# Patient Record
Sex: Female | Born: 1990
Health system: Southern US, Community
[De-identification: ages and names within clinical notes are randomized; demographics above are authoritative.]

## PROBLEM LIST (undated history)

## (undated) DIAGNOSIS — N83209 Unspecified ovarian cyst, unspecified side: Secondary | ICD-10-CM

## (undated) DIAGNOSIS — A6000 Herpesviral infection of urogenital system, unspecified: Secondary | ICD-10-CM

## (undated) DIAGNOSIS — J45909 Unspecified asthma, uncomplicated: Secondary | ICD-10-CM

## (undated) HISTORY — PX: EYE MUSCLE SURGERY: SHX370

## (undated) HISTORY — PX: OVARIAN CYST REMOVAL: SHX89

---

## 2013-06-05 ENCOUNTER — Emergency Department (HOSPITAL_COMMUNITY)
Admission: EM | Admit: 2013-06-05 | Discharge: 2013-06-05 | Disposition: A | Discharge status: Home / Self Care | Payer: 59 | Attending: Emergency Medicine | Admitting: Emergency Medicine

## 2013-06-05 ENCOUNTER — Emergency Department (HOSPITAL_COMMUNITY): Payer: 59

## 2013-06-05 ENCOUNTER — Encounter (HOSPITAL_COMMUNITY): Payer: Self-pay | Admitting: Emergency Medicine

## 2013-06-05 DIAGNOSIS — Z79899 Other long term (current) drug therapy: Secondary | ICD-10-CM | POA: Insufficient documentation

## 2013-06-05 DIAGNOSIS — S0990XA Unspecified injury of head, initial encounter: Secondary | ICD-10-CM | POA: Insufficient documentation

## 2013-06-05 DIAGNOSIS — S161XXA Strain of muscle, fascia and tendon at neck level, initial encounter: Secondary | ICD-10-CM

## 2013-06-05 DIAGNOSIS — Y939 Activity, unspecified: Secondary | ICD-10-CM | POA: Insufficient documentation

## 2013-06-05 DIAGNOSIS — Y92410 Unspecified street and highway as the place of occurrence of the external cause: Secondary | ICD-10-CM | POA: Insufficient documentation

## 2013-06-05 DIAGNOSIS — Z880 Allergy status to penicillin: Secondary | ICD-10-CM | POA: Insufficient documentation

## 2013-06-05 DIAGNOSIS — S139XXA Sprain of joints and ligaments of unspecified parts of neck, initial encounter: Secondary | ICD-10-CM | POA: Insufficient documentation

## 2013-06-05 HISTORY — DX: Herpesviral infection of urogenital system, unspecified: A60.00

## 2013-06-05 HISTORY — DX: Unspecified asthma, uncomplicated: J45.909

## 2013-06-05 MED ORDER — IBUPROFEN 400 MG PO TABS
800.0000 mg | ORAL_TABLET | Freq: Once | ORAL | Status: AC
  Administered 2013-06-05: 800 mg via ORAL
  Filled 2013-06-05: qty 2

## 2013-06-05 NOTE — ED Notes (Signed)
Received report from off going RN
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
## 2013-06-08 NOTE — ED Provider Notes (Signed)
Medical screening examination/treatment/procedure(s) were performed by non-physician practitioner and as supervising physician I was immediately available for consultation/collaboration.    Gilda Crease, MD 06/08/13 [phone]

## 2013-11-23 ENCOUNTER — Inpatient Hospital Stay (HOSPITAL_COMMUNITY): Payer: 59

## 2013-11-23 ENCOUNTER — Inpatient Hospital Stay (HOSPITAL_COMMUNITY)
Admission: AD | Admit: 2013-11-23 | Discharge: 2013-11-23 | Disposition: A | Discharge status: Home / Self Care | Payer: 59 | Source: Ambulatory Visit | Attending: Obstetrics and Gynecology | Admitting: Obstetrics and Gynecology

## 2013-11-23 ENCOUNTER — Encounter (HOSPITAL_COMMUNITY): Payer: Self-pay | Admitting: *Deleted

## 2013-11-23 DIAGNOSIS — D279 Benign neoplasm of unspecified ovary: Secondary | ICD-10-CM

## 2013-11-23 DIAGNOSIS — J45909 Unspecified asthma, uncomplicated: Secondary | ICD-10-CM | POA: Insufficient documentation

## 2013-11-23 DIAGNOSIS — R109 Unspecified abdominal pain: Secondary | ICD-10-CM | POA: Insufficient documentation

## 2013-11-23 DIAGNOSIS — D271 Benign neoplasm of left ovary: Secondary | ICD-10-CM | POA: Diagnosis present

## 2013-11-23 DIAGNOSIS — N83209 Unspecified ovarian cyst, unspecified side: Secondary | ICD-10-CM | POA: Insufficient documentation

## 2013-11-23 DIAGNOSIS — Z975 Presence of (intrauterine) contraceptive device: Secondary | ICD-10-CM

## 2013-11-23 LAB — URINALYSIS, ROUTINE W REFLEX MICROSCOPIC
Bilirubin Urine: NEGATIVE
Glucose, UA: NEGATIVE mg/dL
Ketones, ur: NEGATIVE mg/dL
Nitrite: NEGATIVE
PH: 7 (ref 5.0–8.0)
Protein, ur: NEGATIVE mg/dL
SPECIFIC GRAVITY, URINE: 1.015 (ref 1.005–1.030)
Urobilinogen, UA: 0.2 mg/dL (ref 0.0–1.0)

## 2013-11-23 LAB — URINE MICROSCOPIC-ADD ON

## 2013-11-23 LAB — POCT PREGNANCY, URINE: Preg Test, Ur: NEGATIVE

## 2013-11-23 MED ORDER — HYDROCODONE-ACETAMINOPHEN 5-500 MG PO TABS: 1.0000 | ORAL_TABLET | Freq: Four times a day (QID) | ORAL | Status: DC | PRN

## 2013-11-23 MED ORDER — IBUPROFEN 600 MG PO TABS: 600.0000 mg | ORAL_TABLET | Freq: Four times a day (QID) | ORAL | Status: DC | PRN

## 2013-11-23 MED ORDER — IBUPROFEN 800 MG PO TABS
800.0000 mg | ORAL_TABLET | Freq: Once | ORAL | Status: AC
  Administered 2013-11-23: 800 mg via ORAL
  Filled 2013-11-23: qty 1

## 2013-11-23 NOTE — MAU Note (Signed)
Has had IUD for a week -started having abdominal pain for past 4 days; c/o bumps on her pubic area that itch for past 2 days; felt strings; hx of HSV;

## 2013-11-23 NOTE — Discharge Instructions (Signed)
Ovarian Cyst °An ovarian cyst is a fluid-filled sac that forms on an ovary. The ovaries are small organs that produce eggs in women. Various types of cysts can form on the ovaries. Most are not cancerous. Many do not cause problems, and they often go away on their own. Some may cause symptoms and require treatment. Common types of ovarian cysts include: °· Functional cysts These cysts may occur every month during the menstrual cycle. This is normal. The cysts usually go away with the next menstrual cycle if the woman does not get pregnant. Usually, there are no symptoms with a functional cyst. °· Endometrioma cysts These cysts form from the tissue that lines the uterus. They are also called "chocolate cysts" because they become filled with blood that turns brown. This type of cyst can cause pain in the lower abdomen during intercourse and with your menstrual period. °· Cystadenoma cysts This type develops from the cells on the outside of the ovary. These cysts can get very big and cause lower abdomen pain and pain with intercourse. This type of cyst can twist on itself, cut off its blood supply, and cause severe pain. It can also easily rupture and cause a lot of pain. °· Dermoid cysts This type of cyst is sometimes found in both ovaries. These cysts may contain different kinds of body tissue, such as skin, teeth, hair, or cartilage. They usually do not cause symptoms unless they get very big. °· Theca lutein cysts These cysts occur when too much of a certain hormone (human chorionic gonadotropin) is produced and overstimulates the ovaries to produce an egg. This is most common after procedures used to assist with the conception of a baby (in vitro fertilization). °CAUSES  °· Fertility drugs can cause a condition in which multiple large cysts are formed on the ovaries. This is called ovarian hyperstimulation syndrome. °· A condition called polycystic ovary syndrome can cause hormonal imbalances that can lead to  nonfunctional ovarian cysts. °SIGNS AND SYMPTOMS  °Many ovarian cysts do not cause symptoms. If symptoms are present, they may include: °· Pelvic pain or pressure. °· Pain in the lower abdomen. °· Pain during sexual intercourse. °· Increasing girth (swelling) of the abdomen. °· Abnormal menstrual periods. °· Increasing pain with menstrual periods. °· Stopping having menstrual periods without being pregnant. °DIAGNOSIS  °These cysts are commonly found during a routine or annual pelvic exam. Tests may be ordered to find out more about the cyst. These tests may include: °· Ultrasound. °· X-ray of the pelvis. °· CT scan. °· MRI. °· Blood tests. °TREATMENT  °Many ovarian cysts go away on their own without treatment. Your health care provider may want to check your cyst regularly for 2 3 months to see if it changes. For women in menopause, it is particularly important to monitor a cyst closely because of the higher rate of ovarian cancer in menopausal women. When treatment is needed, it may include any of the following: °· A procedure to drain the cyst (aspiration). This may be done using a long needle and ultrasound. It can also be done through a laparoscopic procedure. This involves using a thin, lighted tube with a tiny camera on the end (laparoscope) inserted through a small incision. °· Surgery to remove the whole cyst. This may be done using laparoscopic surgery or an open surgery involving a larger incision in the lower abdomen. °· Hormone treatment or birth control pills. These methods are sometimes used to help dissolve a cyst. °HOME CARE   INSTRUCTIONS   Only take over-the-counter or prescription medicines as directed by your health care provider.  Follow up with your health care provider as directed.  Get regular pelvic exams and Pap tests. SEEK MEDICAL CARE IF:   Your periods are late, irregular, or painful, or they stop.  Your pelvic pain or abdominal pain does not go away.  Your abdomen becomes  larger or swollen.  You have pressure on your bladder or trouble emptying your bladder completely.  You have pain during sexual intercourse.  You have feelings of fullness, pressure, or discomfort in your stomach.  You lose weight for no apparent reason.  You feel generally ill.  You become constipated.  You lose your appetite.  You develop acne.  You have an increase in body and facial hair.  You are gaining weight, without changing your exercise and eating habits.  You think you are pregnant. SEEK IMMEDIATE MEDICAL CARE IF:   You have increasing abdominal pain.  You feel sick to your stomach (nauseous), and you throw up (vomit).  You develop a fever that comes on suddenly.  You have abdominal pain during a bowel movement.  Your menstrual periods become heavier than usual. Document Released: 09/01/2005 Document Revised: 06/22/2013 Document Reviewed: 05/09/2013 [facility] Patient Information 2014 [place].  Ovarian Cystectomy Ovarian cystectomy is surgery to remove a fluid-filled sac (cyst) on an ovary. The ovaries are small organs that produce eggs in women. Various types of cysts can form on the ovaries. Most are not cancerous. Surgery may be done if a cyst is large or is causing symptoms such as pain. It may also be done for a cyst that is or might be cancerous. This surgery can be done using a laparoscopic technique or an open abdominal technique. The laparoscopic technique involves smaller cuts (incisions) and a faster recovery time. The technique used will depend on your age, the type of cyst, and whether the cyst is cancerous. The laparoscopic technique is not used for a cancerous cyst. LET [facility] CARE PROVIDER KNOW ABOUT:   Any allergies you have.  All medicines you are taking, including vitamins, herbs, eye drops, creams, and over-the-counter medicines.  Previous problems you or members of your family have had with the use of anesthetics.  Any blood  disorders you have.  Previous surgeries you have had.  Medical conditions you have.  Any chance you might be pregnant. RISKS AND COMPLICATIONS Generally, this is a safe procedure. However, as with any procedure, complications can occur. Possible complications include:  Excessive bleeding.  Infection.  Injury to other organs.  Blood clots.  Becoming incapable of getting pregnant (infertile). BEFORE THE PROCEDURE  Ask your health care provider about changing or stopping any regular medicines. Avoid taking aspirin, ibuprofen, or blood thinners as directed by your health care provider.  Do not eat or drink anything after midnight the night before surgery.  If you smoke, do not smoke for at least 2 weeks before your surgery.  Do not drink alcohol the day before your surgery.  Let your health care provider know if you develop a cold or any infection before your surgery.  Arrange for someone to drive you home after the procedure or after your hospital stay. Also arrange for someone to help you with activities during recovery. PROCEDURE  Either a laparoscopic technique or an open abdominal technique may be used for this surgery.  Small monitors will be put on your body. They are used to check your heart, blood  pressure, and oxygen level.   An IV access tube will be put into one of your veins. Medicine will be able to flow directly into your body through this IV tube.   You might be given a medicine to help you relax (sedative).   You will be given a medicine to make you sleep (general anesthetic). A breathing tube may be placed into your lungs during the procedure. Laparoscopic Technique  Several small cuts (incisions) are made in your abdomen. These are typically about 1 to 2 cm long.   Your abdomen will be filled with carbon dioxide gas so that it expands. This gives the surgeon more room to operate and makes your organs easier to see.   A thin, lighted tube with a  tiny camera on the end (laparoscope) is put through one of the small incisions. The camera on the laparoscope sends a picture to a TV screen in the operating room. This gives the surgeon a good view inside your abdomen.   Hollow tubes are put through the other small incisions in your abdomen. The tools needed for the procedure are put through these tubes.  The ovary with the cyst is identified, and the cyst is removed. It is sent to the lab for testing. If it is cancer, both ovaries may need to be removed during a different surgery.  Tools are removed. The incisions are then closed with stitches or skin glue, and dressings may be applied. Open Abdominal Technique  A single large incision is made along your bikini line or in the middle of your lower abdomen.  The ovary with the cyst is identified, and the cyst is removed. It is sent to the lab for testing. If it is cancer, both ovaries may need to be removed during a different surgery.  The incision is then closed with stitches or staples. AFTER THE PROCEDURE   You will wake up from anesthesia and be taken to a recovery area.  If you had laparoscopic surgery, you may be able to go home the same day, or you may need to stay in the hospital overnight.  If you had open abdominal surgery, you will need to stay in the hospital for a few days.  Your IV access tube and catheter will be removed the first or second day, after you are able to eat and drink enough.  You may be given medicine to relieve pain or to help you sleep.  You may be given an antibiotic medicine if needed. Document Released: 06/29/2007 Document Revised: 06/22/2013 Document Reviewed: 04/13/2013 [facility] Patient Information 2014 [place].   Ovarian Torsion The ovaries are female reproductive organs that produce eggs. Ovarian torsion is when an ovary becomes twisted and cuts off its own blood supply. This can occur at any age. If an ovary is twisted, it cannot get  blood and the ovary swells. It is a painful medical emergency. It must be treated quickly. If too much time has past, blood flow to the ovary may not be restored and the ovary may have to be removed. CAUSES Torsion can happen in an ovary that is normal size. However, most of the time it occurs in an ovary that is enlarged. An ovary can become enlarged because of:  Harmless (benign) tumors on the ovaries.  Cancerous tumors.  Ovarian cysts, which are fluid-filled sacs.  Normal pregnancy.  A pregnancy that occurs outside the uterus (ectopic pregnancy). RISK FACTORS Risk factors are things that increase the likelihood of this  condition happening. The risk factors include:  Having fallopian tubes that are longer than normal.  Having ovaries that are larger than normal.  Taking fertility medicine to become pregnant.  Having had surgery in the pelvic area. SYMPTOMS  Sudden pain in the lower abdomen, usually on one side only.  Pelvic pain that starts after exercise.  Pelvic pain that gets worse over time.  Severe pelvic pain that comes and goes.  Pelvic pain that spreads into the lower back or thigh.  Nausea and vomiting along with pelvic pain. DIAGNOSIS Your caregiver will take a history and perform a physical exam. He or she may be able to feel an enlarged ovary. Your caregiver may order some further tests, which include:  A pregnancy test.  Imaging tests, such as pelvic Doppler ultrasonography, that measures blood flow, CT scan, or MRI. Your caregiver may also perform a diagnositic laparoscopic exam. A small surgical cut (incision) will be made in your abdomen. Then, a small lighted telescope is put through the opening. This allows your caregiver to clearly see your ovary and fallopian tube.  TREATMENT Surgery is needed when an ovary becomes twisted. It is best to do this 8 hours or less after the ovary becomes twisted. Laparoscopic ovarian torsion surgery is done to try to  untwist the ovary. Sometimes, a large incision has to be made in the abdomen (laparotomy) to relieve the ovary. If the ovary cannot be untwisted, the ovary will have to be surgically removed during a procedure called salpingo-oophorectomy. Document Released: 08/21/2011 Document Revised: 11/24/2011 Document Reviewed: 08/21/2011 [facility] Patient Information 2014 [place], [place].

## 2013-11-23 NOTE — MAU Note (Signed)
Patient states she had Mirena IUD placed by her MD in [place] on 3-2. Had a little for a few days after placement. Woke up 3-8 with sharp lower abdominal pain. States she has felt the string. State she had 3 bumps on the pubic area that are white. Has a vaginal discharge but has been there since placement of IUD.

## 2013-11-23 NOTE — MAU Provider Note (Signed)
CC: Abdominal Pain    First Provider Initiated Contact with Patient 11/23/13 1907      HPI Leah [name] is a 23 y.o. G0P0   who presents with onset 4 days ago of severe sharp pelvic and lower abdominal pain. Pain is continuous that waxes and wanes. The pain is more severe than she normally has with menses. LMP 11/03/2013. Ibuprofen 800 mg gives some relief temporarily.  She is a Ship broker at [employer] and saw her gynecologist in [place] on 11-2013 and had Mirena IUD inserted. She had mild cramping for a few days which resolved. Since insertion she has had spotting but no heavy bleeding. Just completed Flagyl for BV.    Past Medical History  Diagnosis Date  . Asthma   . Herpes genitalia     OB History  Gravida Para Term Preterm AB SAB TAB Ectopic Multiple Living  0                 Past Surgical History  Procedure Laterality Date  . Eye muscle surgery      History   Social History  . Marital Status: Single    Spouse Name: N/A    Number of Children: N/A  . Years of Education: N/A   Occupational History  . Not on file.   Social History Main Topics  . Smoking status: Never Smoker   . Smokeless tobacco: Not on file  . Alcohol Use: No  . Drug Use: No  . Sexual Activity: Not on file   Other Topics Concern  . Not on file   Social History Narrative  . No narrative on file    No current facility-administered medications on file prior to encounter.   Current Outpatient Prescriptions on File Prior to Encounter  Medication Sig Dispense Refill  . valACYclovir (VALTREX) 500 MG tablet Take 500 mg by mouth daily.        Allergies  Allergen Reactions  . Penicillins Other (See Comments)    Family allergy     ROS Pertinent items in HPI  PHYSICAL EXAM Filed Vitals:   11/23/13 1825  BP: 125/62  Pulse: 93  Temp: 99.1 F (37.3 C)  Resp: 16   General: Well nourished, well developed female in no acute distress Cardiovascular: Normal rate Respiratory: Normal effort Abdomen:  Soft, nontender Back: No CVAT Extremities: No edema Neurologic: Alert and oriented Speculum exam: Mons is shaved has .5cm indurated lesion c/w healing furuncle, no evidence of genital HSV;  vagina with physiologic discharge, no blood; cervix clean with IUD strings in place Bimanual exam: cervix closed, no CMT; uterus NSSP; no adnexal tenderness or masses   LAB RESULTS Results for orders placed during the hospital encounter of 11/23/13 (from the past 24 hour(s))  URINALYSIS, ROUTINE W REFLEX MICROSCOPIC     Status: Abnormal   Collection Time    11/23/13  6:30 PM      Result Value Ref Range   Color, Urine YELLOW  YELLOW   APPearance CLEAR  CLEAR   Specific Gravity, Urine 1.015  1.005 - 1.030   pH 7.0  5.0 - 8.0   Glucose, UA NEGATIVE  NEGATIVE mg/dL   Hgb urine dipstick MODERATE (*) NEGATIVE   Bilirubin Urine NEGATIVE  NEGATIVE   Ketones, ur NEGATIVE  NEGATIVE mg/dL   Protein, ur NEGATIVE  NEGATIVE mg/dL   Urobilinogen, UA 0.2  0.0 - 1.0 mg/dL   Nitrite NEGATIVE  NEGATIVE   Leukocytes, UA TRACE (*) NEGATIVE  URINE MICROSCOPIC-ADD ON  Status: Abnormal   Collection Time    11/23/13  6:30 PM      Result Value Ref Range   Squamous Epithelial / LPF FEW (*) RARE   WBC, UA 3-6  <3 WBC/hpf   RBC / HPF 3-6  <3 RBC/hpf  POCT PREGNANCY, URINE     Status: None   Collection Time    11/23/13  6:46 PM      Result Value Ref Range   Preg Test, Ur NEGATIVE  NEGATIVE    IMAGING No results found.  MAU COURSE Reviewed preliminary [place] report with Dr. Glo Herring: rt cmplx hemorrhagic cyst 3.6x3.8cm; left: complex 5.2x4.8 probable dermoid and 3 cm simple cyst Kline assumed care at 2120  ASSESSMENT  No diagnosis found.  PLAN Discharge home. See AVS for patient education.    Medication List    ASK your doctor about these medications       ibuprofen 200 MG tablet  Commonly known as:  ADVIL,MOTRIN  Take 800 mg by mouth every 6 (six) hours as needed.     VALTREX 500 MG  tablet  Generic drug:  valACYclovir  Take 500 mg by mouth daily.          Lorene Dy, CNM 11/23/2013 7:29 PM

## 2013-11-24 ENCOUNTER — Other Ambulatory Visit: Payer: Self-pay | Admitting: Advanced Practice Midwife

## 2013-11-24 MED ORDER — HYDROCODONE-ACETAMINOPHEN 5-325 MG PO TABS: 1.0000 | ORAL_TABLET | Freq: Four times a day (QID) | ORAL | Status: DC | PRN

## 2013-11-24 MED ORDER — HYDROCODONE-ACETAMINOPHEN 5-325 MG PO TABS
1.0000 | ORAL_TABLET | Freq: Four times a day (QID) | ORAL | Status: DC | PRN
Start: 2013-11-24 — End: 2013-11-24

## 2013-11-24 NOTE — Progress Notes (Unsigned)
Rx never would print (rx'd by myself and Delana Meyer) many times.  Finally given a written rx.  [name],Leah [name]

## 2013-11-24 NOTE — Progress Notes (Signed)
rx for vicodin 5/500 written yesterday; dosage is no longer being made.  New rx for 5/325 given to pt

## 2013-11-25 NOTE — MAU Provider Note (Signed)
Attestation of Attending Supervision of Advanced Practitioner: Evaluation and management procedures were performed by the PA/NP/CNM/OB Fellow under my supervision/collaboration. Chart reviewed and agree with management and plan.  Kamari Bilek V 11/25/2013 6:45 PM

## 2013-12-12 ENCOUNTER — Encounter: Payer: 59 | Admitting: Obstetrics & Gynecology

## 2014-01-19 ENCOUNTER — Emergency Department (HOSPITAL_COMMUNITY): Payer: 59

## 2014-01-19 ENCOUNTER — Encounter (HOSPITAL_COMMUNITY): Payer: Self-pay | Admitting: Emergency Medicine

## 2014-01-19 ENCOUNTER — Emergency Department (HOSPITAL_COMMUNITY)
Admission: EM | Admit: 2014-01-19 | Discharge: 2014-01-19 | Disposition: A | Discharge status: Home / Self Care | Payer: 59 | Attending: Emergency Medicine | Admitting: Emergency Medicine

## 2014-01-19 DIAGNOSIS — Z8619 Personal history of other infectious and parasitic diseases: Secondary | ICD-10-CM | POA: Insufficient documentation

## 2014-01-19 DIAGNOSIS — N390 Urinary tract infection, site not specified: Secondary | ICD-10-CM | POA: Insufficient documentation

## 2014-01-19 DIAGNOSIS — Z79899 Other long term (current) drug therapy: Secondary | ICD-10-CM | POA: Insufficient documentation

## 2014-01-19 DIAGNOSIS — R1031 Right lower quadrant pain: Secondary | ICD-10-CM | POA: Insufficient documentation

## 2014-01-19 DIAGNOSIS — Z880 Allergy status to penicillin: Secondary | ICD-10-CM | POA: Insufficient documentation

## 2014-01-19 DIAGNOSIS — R000 Tachycardia, unspecified: Secondary | ICD-10-CM | POA: Insufficient documentation

## 2014-01-19 DIAGNOSIS — Z8742 Personal history of other diseases of the female genital tract: Secondary | ICD-10-CM | POA: Insufficient documentation

## 2014-01-19 DIAGNOSIS — Z3202 Encounter for pregnancy test, result negative: Secondary | ICD-10-CM | POA: Insufficient documentation

## 2014-01-19 DIAGNOSIS — R42 Dizziness and giddiness: Secondary | ICD-10-CM | POA: Insufficient documentation

## 2014-01-19 DIAGNOSIS — R109 Unspecified abdominal pain: Secondary | ICD-10-CM

## 2014-01-19 DIAGNOSIS — R112 Nausea with vomiting, unspecified: Secondary | ICD-10-CM | POA: Insufficient documentation

## 2014-01-19 DIAGNOSIS — J45909 Unspecified asthma, uncomplicated: Secondary | ICD-10-CM | POA: Insufficient documentation

## 2014-01-19 HISTORY — DX: Unspecified ovarian cyst, unspecified side: N83.209

## 2014-01-19 LAB — URINALYSIS, ROUTINE W REFLEX MICROSCOPIC
Glucose, UA: NEGATIVE mg/dL
Ketones, ur: 40 mg/dL — AB
Nitrite: NEGATIVE
PH: 6 (ref 5.0–8.0)
Protein, ur: NEGATIVE mg/dL
SPECIFIC GRAVITY, URINE: 1.027 (ref 1.005–1.030)
UROBILINOGEN UA: 1 mg/dL (ref 0.0–1.0)

## 2014-01-19 LAB — BASIC METABOLIC PANEL
BUN: 9 mg/dL (ref 6–23)
CALCIUM: 8.4 mg/dL (ref 8.4–10.5)
CO2: 23 meq/L (ref 19–32)
CREATININE: 0.86 mg/dL (ref 0.50–1.10)
Chloride: 101 mEq/L (ref 96–112)
GFR calc Af Amer: >90 mL/min (ref >90)
GFR calc non Af Amer: >90 mL/min (ref >90)
Glucose, Bld: 104 mg/dL — ABNORMAL HIGH (ref 70–99)
Potassium: 3.3 mEq/L — ABNORMAL LOW (ref 3.7–5.3)
Sodium: 139 mEq/L (ref 137–147)

## 2014-01-19 LAB — CBC
HCT: 36.3 % (ref 36.0–46.0)
Hemoglobin: 11.9 g/dL — ABNORMAL LOW (ref 12.0–15.0)
MCH: 28.2 pg (ref 26.0–34.0)
MCHC: 32.8 g/dL (ref 30.0–36.0)
MCV: 86 fL (ref 78.0–100.0)
PLATELETS: 294 10*3/uL (ref 150–400)
RBC: 4.22 MIL/uL (ref 3.87–5.11)
RDW: 12.9 % (ref 11.5–15.5)
WBC: 14.7 10*3/uL — ABNORMAL HIGH (ref 4.0–10.5)

## 2014-01-19 LAB — PREGNANCY, URINE: Preg Test, Ur: NEGATIVE

## 2014-01-19 LAB — URINE MICROSCOPIC-ADD ON

## 2014-01-19 LAB — LIPASE, BLOOD: LIPASE: 14 U/L (ref 11–59)

## 2014-01-19 LAB — RAPID STREP SCREEN (MED CTR MEBANE ONLY): Streptococcus, Group A Screen (Direct): NEGATIVE

## 2014-01-19 MED ORDER — ONDANSETRON 4 MG PO TBDP: 4.0000 mg | ORAL_TABLET | Freq: Three times a day (TID) | ORAL | Status: DC | PRN

## 2014-01-19 MED ORDER — IOHEXOL 300 MG/ML  SOLN
100.0000 mL | Freq: Once | INTRAMUSCULAR | Status: AC | PRN
  Administered 2014-01-19: 100 mL via INTRAVENOUS

## 2014-01-19 MED ORDER — ONDANSETRON HCL 4 MG/2ML IJ SOLN
4.0000 mg | Freq: Once | INTRAMUSCULAR | Status: AC
  Administered 2014-01-19: 4 mg via INTRAVENOUS
  Filled 2014-01-19: qty 2

## 2014-01-19 MED ORDER — ACETAMINOPHEN 325 MG PO TABS
325.0000 mg | ORAL_TABLET | Freq: Once | ORAL | Status: AC
  Administered 2014-01-19: 325 mg via ORAL
  Filled 2014-01-19: qty 1

## 2014-01-19 MED ORDER — SODIUM CHLORIDE 0.9 % IV BOLUS (SEPSIS)
1000.0000 mL | Freq: Once | INTRAVENOUS | Status: AC
  Administered 2014-01-19: 1000 mL via INTRAVENOUS

## 2014-01-19 MED ORDER — SULFAMETHOXAZOLE-TRIMETHOPRIM 800-160 MG PO TABS: 1.0000 | ORAL_TABLET | Freq: Two times a day (BID) | ORAL | Status: DC

## 2014-01-19 MED ORDER — HYDROCODONE-ACETAMINOPHEN 5-325 MG PO TABS: 2.0000 | ORAL_TABLET | ORAL | Status: DC | PRN

## 2014-01-19 MED ORDER — SODIUM CHLORIDE 0.9 % IV BOLUS (SEPSIS)
1000.0000 mL | Freq: Once | INTRAVENOUS | Status: AC
Start: 2014-01-19 — End: 2014-01-19
  Administered 2014-01-19: 1000 mL via INTRAVENOUS

## 2014-01-19 MED ORDER — IOHEXOL 300 MG/ML  SOLN
25.0000 mL | Freq: Once | INTRAMUSCULAR | Status: AC | PRN
  Administered 2014-01-19: 25 mL via ORAL

## 2014-01-19 NOTE — ED Notes (Signed)
Pt reports nausea and loss of appetite over the past few days; tachy with low grade fever. A&Ox3; no signs of distress. Vomited last night x 2.

## 2014-01-19 NOTE — ED Provider Notes (Signed)
Results for orders placed during the hospital encounter of 01/19/14  RAPID STREP SCREEN      Result Value Ref Range   Streptococcus, Group A Screen (Direct) NEGATIVE  NEGATIVE  CBC      Result Value Ref Range   WBC 14.7 (*) 4.0 - 10.5 K/uL   RBC 4.22  3.87 - 5.11 MIL/uL   Hemoglobin 11.9 (*) 12.0 - 15.0 g/dL   HCT 36.3  36.0 - 46.0 %   MCV 86.0  78.0 - 100.0 fL   MCH 28.2  26.0 - 34.0 pg   MCHC 32.8  30.0 - 36.0 g/dL   RDW 12.9  11.5 - 15.5 %   Platelets 294  150 - 400 K/uL  BASIC METABOLIC PANEL      Result Value Ref Range   Sodium 139  137 - 147 mEq/L   Potassium 3.3 (*) 3.7 - 5.3 mEq/L   Chloride 101  96 - 112 mEq/L   CO2 23  19 - 32 mEq/L   Glucose, Bld 104 (*) 70 - 99 mg/dL   BUN 9  6 - 23 mg/dL   Creatinine, Ser 0.86  0.50 - 1.10 mg/dL   Calcium 8.4  8.4 - 10.5 mg/dL   GFR calc non Af Amer >90  >90 mL/min   GFR calc Af Amer >90  >90 mL/min  LIPASE, BLOOD      Result Value Ref Range   Lipase 14  11 - 59 U/L  PREGNANCY, URINE      Result Value Ref Range   Preg Test, Ur NEGATIVE  NEGATIVE  URINALYSIS, ROUTINE W REFLEX MICROSCOPIC      Result Value Ref Range   Color, Urine YELLOW  YELLOW   APPearance HAZY (*) CLEAR   Specific Gravity, Urine 1.027  1.005 - 1.030   pH 6.0  5.0 - 8.0   Glucose, UA NEGATIVE  NEGATIVE mg/dL   Hgb urine dipstick LARGE (*) NEGATIVE   Bilirubin Urine SMALL (*) NEGATIVE   Ketones, ur 40 (*) NEGATIVE mg/dL   Protein, ur NEGATIVE  NEGATIVE mg/dL   Urobilinogen, UA 1.0  0.0 - 1.0 mg/dL   Nitrite NEGATIVE  NEGATIVE   Leukocytes, UA SMALL (*) NEGATIVE  URINE MICROSCOPIC-ADD ON      Result Value Ref Range   Squamous Epithelial / LPF MANY (*) RARE   WBC, UA 11-20  <3 WBC/hpf   RBC / HPF 11-20  <3 RBC/hpf   Bacteria, UA MANY (*) RARE   Urine-Other RARE YEAST     Dg Chest 2 View  01/19/2014   CLINICAL DATA:  Tachycardia.  EXAM: CHEST  2 VIEW  COMPARISON:  None.  FINDINGS: Mediastinum and hilar structures normal. Lungs are clear. Heart size  normal.  IMPRESSION: No pleural effusion or pneumothorax.   Electronically Signed   By: Marcello Moores  Register   On: 01/19/2014 12:27   US Transvaginal Non-ob  01/19/2014   CLINICAL DATA:  Pelvic pain with nausea and vomiting and off and on chills. LMP 01/17/2014.  EXAM: TRANSABDOMINAL AND TRANSVAGINAL ULTRASOUND OF PELVIS  DOPPLER ULTRASOUND OF OVARIES  TECHNIQUE: Both transabdominal and transvaginal ultrasound examinations of the pelvis were performed. Transabdominal technique was performed for global imaging of the pelvis including uterus, ovaries, adnexal regions, and pelvic cul-de-sac.  It was necessary to proceed with endovaginal exam following the transabdominal exam to visualize the ovaries and endometrium. Color and duplex Doppler ultrasound was utilized to evaluate blood flow to the ovaries.  COMPARISON:  CT abdomen pelvis 01/19/2014 and pelvic ultrasound 11/24/1998.  FINDINGS: Uterus  Measurements: 5.9 x 3.0 x 4.5 cm and anteverted. No fibroids or other mass visualized.  Endometrium  Thickness: 6 mm. Intrauterine device is satisfactorily positioned within the endometrial canal.  Right ovary  Measurements: 4.4 x 2.4 x 3.3 cm. Normal appearance/no adnexal mass. Pulsed Doppler evaluation of the right ovary demonstrates normal low resistance arterial and venous waveforms.  Left ovary  Measurements: Measures approximately 7.9 x 4.5 x 5.3 cm and contains an approximately 5.3 x 3.9 x 4.6 cm mass with multiple low level internal echoes and scattered areas of hyperechogenicity within it with associated shadowing. This has sonographic appearance s compatible with an ovarian dermoid, and definite fat within this mass was confirmed on CT abdomen pelvis performed today, confirming a dermoid. There is also a simple cyst exophytic from or immediately adjacent to the left ovary that measures 3.5 x 2.7 x 3.7 cm. No discrete normal ovarian parenchyma is seen on the left. On color Doppler imaging, there is color Doppler flow  along the periphery of the dermoid and left adnexal simple cyst. No flow seen within the dermoid or simple cyst. Arterial and venous waveforms can be seen within this rind of tissue surrounding the complex mass and cyst.  Other findings  Small amount of free fluid in the pelvis.  IMPRESSION: 1. Left ovarian dermoid measures approximately 5.3 x 3.9 x 4.6 cm. There is also a left ovarian versus adnexal simple cyst that measures 3.5 x 2.7 x 3.7 cm. Please note that the presence of the dermoid does place the patient at increased risk for ovarian torsion. Arterial and venous waveforms are seen around the periphery of the large dermoid on today's ultrasound, with no definite sonographic evidence of torsion. Consider gynecology consultation or follow-up. 2. Normal right ovary. 3. Satisfactory position of intrauterine device.   Electronically Signed   By: Curlene Dolphin M.D.   On: 01/19/2014 16:26   US Pelvis Complete  01/19/2014   CLINICAL DATA:  Pelvic pain with nausea and vomiting and off and on chills. LMP 01/17/2014.  EXAM: TRANSABDOMINAL AND TRANSVAGINAL ULTRASOUND OF PELVIS  DOPPLER ULTRASOUND OF OVARIES  TECHNIQUE: Both transabdominal and transvaginal ultrasound examinations of the pelvis were performed. Transabdominal technique was performed for global imaging of the pelvis including uterus, ovaries, adnexal regions, and pelvic cul-de-sac.  It was necessary to proceed with endovaginal exam following the transabdominal exam to visualize the ovaries and endometrium. Color and duplex Doppler ultrasound was utilized to evaluate blood flow to the ovaries.  COMPARISON:  CT abdomen pelvis 01/19/2014 and pelvic ultrasound 11/24/1998.  FINDINGS: Uterus  Measurements: 5.9 x 3.0 x 4.5 cm and anteverted. No fibroids or other mass visualized.  Endometrium  Thickness: 6 mm. Intrauterine device is satisfactorily positioned within the endometrial canal.  Right ovary  Measurements: 4.4 x 2.4 x 3.3 cm. Normal appearance/no adnexal  mass. Pulsed Doppler evaluation of the right ovary demonstrates normal low resistance arterial and venous waveforms.  Left ovary  Measurements: Measures approximately 7.9 x 4.5 x 5.3 cm and contains an approximately 5.3 x 3.9 x 4.6 cm mass with multiple low level internal echoes and scattered areas of hyperechogenicity within it with associated shadowing. This has sonographic appearance s compatible with an ovarian dermoid, and definite fat within this mass was confirmed on CT abdomen pelvis performed today, confirming a dermoid. There is also a simple cyst exophytic from or immediately adjacent to the left ovary that measures 3.5 x  2.7 x 3.7 cm. No discrete normal ovarian parenchyma is seen on the left. On color Doppler imaging, there is color Doppler flow along the periphery of the dermoid and left adnexal simple cyst. No flow seen within the dermoid or simple cyst. Arterial and venous waveforms can be seen within this rind of tissue surrounding the complex mass and cyst.  Other findings  Small amount of free fluid in the pelvis.  IMPRESSION: 1. Left ovarian dermoid measures approximately 5.3 x 3.9 x 4.6 cm. There is also a left ovarian versus adnexal simple cyst that measures 3.5 x 2.7 x 3.7 cm. Please note that the presence of the dermoid does place the patient at increased risk for ovarian torsion. Arterial and venous waveforms are seen around the periphery of the large dermoid on today's ultrasound, with no definite sonographic evidence of torsion. Consider gynecology consultation or follow-up. 2. Normal right ovary. 3. Satisfactory position of intrauterine device.   Electronically Signed   By: Curlene Dolphin M.D.   On: 01/19/2014 16:26   Ct Abdomen Pelvis W Contrast  01/19/2014   CLINICAL DATA:  Right lower quadrant abdominal pain.  EXAM: CT ABDOMEN AND PELVIS WITH CONTRAST  TECHNIQUE: Multidetector CT imaging of the abdomen and pelvis was performed using the standard protocol following bolus administration  of intravenous contrast.  CONTRAST:  100 mL OMNIPAQUE IOHEXOL 300 MG/ML  SOLN  COMPARISON:  None.  FINDINGS: The lung bases are clear. There is no pleural or pericardial effusion.  The appendix is well seen posterior to the cecum and is normal in appearance. The stomach and small and large bowel appear normal. A fluid, fat and calcium containing lesion in the left ovary is consistent with a dermoid cyst measuring 5.3 cm AP by 4.6 cm transverse by 5.8 cm craniocaudal. The right ovary is unremarkable. IUD is in place in the uterus. The patient has a tampon in place. Urinary bladder is unremarkable.  The gallbladder, liver, spleen, adrenal glands, kidneys, biliary tree and pancreas all appear normal. No lymphadenopathy is identified. Tiny amount of free pelvic fluid is consistent with physiologic change. No bony abnormality is identified.  IMPRESSION: Negative for appendicitis or other acute abnormality.  Left ovarian dermoid measuring a maximum of 5.8 cm in diameter.   Electronically Signed   By: Inge Rise M.D.   On: 01/19/2014 15:30   [place] Art/ven Flow Abd Pelv Doppler  01/19/2014   CLINICAL DATA:  Pelvic pain with nausea and vomiting and off and on chills. LMP 01/17/2014.  EXAM: TRANSABDOMINAL AND TRANSVAGINAL ULTRASOUND OF PELVIS  DOPPLER ULTRASOUND OF OVARIES  TECHNIQUE: Both transabdominal and transvaginal ultrasound examinations of the pelvis were performed. Transabdominal technique was performed for global imaging of the pelvis including uterus, ovaries, adnexal regions, and pelvic cul-de-sac.  It was necessary to proceed with endovaginal exam following the transabdominal exam to visualize the ovaries and endometrium. Color and duplex Doppler ultrasound was utilized to evaluate blood flow to the ovaries.  COMPARISON:  CT abdomen pelvis 01/19/2014 and pelvic ultrasound 11/24/1998.  FINDINGS: Uterus  Measurements: 5.9 x 3.0 x 4.5 cm and anteverted. No fibroids or other mass visualized.  Endometrium   Thickness: 6 mm. Intrauterine device is satisfactorily positioned within the endometrial canal.  Right ovary  Measurements: 4.4 x 2.4 x 3.3 cm. Normal appearance/no adnexal mass. Pulsed Doppler evaluation of the right ovary demonstrates normal low resistance arterial and venous waveforms.  Left ovary  Measurements: Measures approximately 7.9 x 4.5 x 5.3 cm and contains  an approximately 5.3 x 3.9 x 4.6 cm mass with multiple low level internal echoes and scattered areas of hyperechogenicity within it with associated shadowing. This has sonographic appearance s compatible with an ovarian dermoid, and definite fat within this mass was confirmed on CT abdomen pelvis performed today, confirming a dermoid. There is also a simple cyst exophytic from or immediately adjacent to the left ovary that measures 3.5 x 2.7 x 3.7 cm. No discrete normal ovarian parenchyma is seen on the left. On color Doppler imaging, there is color Doppler flow along the periphery of the dermoid and left adnexal simple cyst. No flow seen within the dermoid or simple cyst. Arterial and venous waveforms can be seen within this rind of tissue surrounding the complex mass and cyst.  Other findings  Small amount of free fluid in the pelvis.  IMPRESSION: 1. Left ovarian dermoid measures approximately 5.3 x 3.9 x 4.6 cm. There is also a left ovarian versus adnexal simple cyst that measures 3.5 x 2.7 x 3.7 cm. Please note that the presence of the dermoid does place the patient at increased risk for ovarian torsion. Arterial and venous waveforms are seen around the periphery of the large dermoid on today's ultrasound, with no definite sonographic evidence of torsion. Consider gynecology consultation or follow-up. 2. Normal right ovary. 3. Satisfactory position of intrauterine device.   Electronically Signed   By: Curlene Dolphin M.D.   On: 01/19/2014 16:26    5:07 PM Patient here with nausea, loss of appetite and low grade fever with vomiting x 2 - no back  pain, but RLQ abdominal pain - CT does not show appendicitis, [place] without acute findings.  Believe the leukocytosis to be more related to possible UTI, which I will treat and send for culture.  Patient is able to tolerate po fluids, heart rate is 108.  Patient is stable to return home and low grade fever of 99.4.   Idalia Needle Joelyn Oms, PA-C 01/19/14 1711

## 2014-01-19 NOTE — ED Provider Notes (Signed)
CSN: [id number]     Arrival date & time 01/19/14  0940 History   First MD Initiated Contact with Patient 01/19/14 1000     Chief Complaint  Patient presents with  . Nausea  . Sore Throat     (Consider location/radiation/quality/duration/timing/severity/associated sxs/prior Treatment) The history is provided by the patient. No language interpreter was used.  Leah [name] is a 23 y/o F with PMHx of asthma, genital herpes and ovarian cysts (getting surgery performed on her left ovarian cyst on Friday 01/27/2014) presenting to the ED with sore throat, generalized bodyaches, nausea, vomiting. Patient reported that the sore throat started yesterady - stated a sharp pain with swallowing. Reported that she has been having generalized bodyaches that have gotten worse. Reported that yesterday she felt nauseous with at least 2 episodes of vomiting. Reported that she has had a low grade fever, today it is 100.93F. Reported that she has been unable to keep any foods or fluids down. Reported that she goes to school and [place] campus. Denied abdominal pain, urinary issues, melena, Medicaid yet, chest pain, shortness of breath, difficulty breathing, cough, nasal congestion, neck pain, neck stiffness, blurred vision, sudden loss of vision. PCP none  Past Medical History  Diagnosis Date  . Asthma   . Herpes genitalia   . Ovarian cyst    Past Surgical History  Procedure Laterality Date  . Eye muscle surgery     Family History  Problem Relation Age of Onset  . Asthma Father   . Asthma Sister   . Asthma Brother   . Asthma Paternal Aunt   . Asthma Paternal Grandmother    History  Substance Use Topics  . Smoking status: Never Smoker   . Smokeless tobacco: Not on file  . Alcohol Use: No   OB History   Grav Para Term Preterm Abortions TAB SAB Ect Mult Living   0              Review of Systems  Constitutional: Positive for fever and chills.  HENT: Positive for sore throat. Negative for  congestion.   Respiratory: Negative for cough, chest tightness and shortness of breath.   Cardiovascular: Negative for chest pain.  Gastrointestinal: Positive for nausea, vomiting and abdominal pain. Negative for diarrhea, constipation, blood in stool and anal bleeding.  Genitourinary: Negative for dysuria and decreased urine volume.  Musculoskeletal: Positive for myalgias (generalized).  Neurological: Positive for light-headedness. Negative for dizziness and weakness.  All other systems reviewed and are negative.     Allergies  Penicillins  Home Medications   Prior to Admission medications   Medication Sig Start Date End Date Taking? Authorizing Provider  albuterol (PROVENTIL HFA;VENTOLIN HFA) 108 (90 BASE) MCG/ACT inhaler Inhale 2 puffs into the lungs every 6 (six) hours as needed for wheezing or shortness of breath.   Yes Historical Provider, MD  HYDROcodone-acetaminophen (NORCO) 5-325 MG per tablet Take 1 tablet by mouth every 6 (six) hours as needed for moderate pain. 11/24/13  Yes Christin Fudge, CNM  ibuprofen (ADVIL,MOTRIN) 600 MG tablet Take 600 mg by mouth every 6 (six) hours as needed for mild pain. 11/23/13  Yes Deirdre Freida Busman, CNM  levonorgestrel (MIRENA) 20 MCG/24HR IUD 1 each by Intrauterine route once.   Yes Historical Provider, MD  valACYclovir (VALTREX) 500 MG tablet Take 500 mg by mouth daily.   Yes Historical Provider, MD   BP 107/62  Pulse 105  Temp(Src) 99.8 F (37.7 C) (Oral)  Resp 18  SpO2 100%  LMP 01/17/2014 Physical Exam  Nursing note and vitals reviewed. Constitutional: She is oriented to person, place, and time. She appears well-developed and well-nourished. No distress.  HENT:  Head: Normocephalic and atraumatic.  Mouth/Throat: Oropharyngeal exudate present.  Eyes: Conjunctivae and EOM are normal. Pupils are equal, round, and reactive to light. Right eye exhibits no discharge. Left eye exhibits no discharge.  Neck: Normal range of motion.  Neck supple. No tracheal deviation present.  Negative neck stiffness Negative nuchal rigidity Negative cervical lymphadenopathy Negative meningeal signs  Cardiovascular: Regular rhythm and normal heart sounds.  Tachycardia present.   Pulmonary/Chest: Effort normal and breath sounds normal. No respiratory distress. She has no wheezes. She has no rales.  Abdominal: Soft. Bowel sounds are normal. She exhibits no distension. There is tenderness in the right lower quadrant. There is guarding. There is no rebound.    Discomfort upon palpation to the right lower quadrant-guarding with palpation  Musculoskeletal: Normal range of motion.  Full ROM to upper and lower extremities without difficulty noted, negative ataxia noted.  Lymphadenopathy:    She has no cervical adenopathy.  Neurological: She is alert and oriented to person, place, and time. No cranial nerve deficit. She exhibits normal muscle tone. Coordination normal.  Skin: Skin is warm and dry. No rash noted. She is not diaphoretic. No erythema.  Psychiatric: She has a normal mood and affect. Her behavior is normal. Thought content normal.    ED Course  Procedures (including critical care time) Labs Review Labs Reviewed  CBC - Abnormal; Notable for the following:    WBC 14.7 (*)    Hemoglobin 11.9 (*)    All other components within normal limits  BASIC METABOLIC PANEL - Abnormal; Notable for the following:    Potassium 3.3 (*)    Glucose, Bld 104 (*)    All other components within normal limits  URINALYSIS, ROUTINE W REFLEX MICROSCOPIC - Abnormal; Notable for the following:    APPearance HAZY (*)    Hgb urine dipstick LARGE (*)    Bilirubin Urine SMALL (*)    Ketones, ur 40 (*)    Leukocytes, UA SMALL (*)    All other components within normal limits  URINE MICROSCOPIC-ADD ON - Abnormal; Notable for the following:    Squamous Epithelial / LPF MANY (*)    Bacteria, UA MANY (*)    All other components within normal limits    RAPID STREP SCREEN  CULTURE, GROUP A STREP  LIPASE, BLOOD  PREGNANCY, URINE    Imaging Review Dg Chest 2 View  01/19/2014   CLINICAL DATA:  Tachycardia.  EXAM: CHEST  2 VIEW  COMPARISON:  None.  FINDINGS: Mediastinum and hilar structures normal. Lungs are clear. Heart size normal.  IMPRESSION: No pleural effusion or pneumothorax.   Electronically Signed   By: Marcello Moores  Register   On: 01/19/2014 12:27   Ct Abdomen Pelvis W Contrast  01/19/2014   CLINICAL DATA:  Right lower quadrant abdominal pain.  EXAM: CT ABDOMEN AND PELVIS WITH CONTRAST  TECHNIQUE: Multidetector CT imaging of the abdomen and pelvis was performed using the standard protocol following bolus administration of intravenous contrast.  CONTRAST:  100 mL OMNIPAQUE IOHEXOL 300 MG/ML  SOLN  COMPARISON:  None.  FINDINGS: The lung bases are clear. There is no pleural or pericardial effusion.  The appendix is well seen posterior to the cecum and is normal in appearance. The stomach and small and large bowel appear normal. A fluid, fat and calcium containing lesion in  the left ovary is consistent with a dermoid cyst measuring 5.3 cm AP by 4.6 cm transverse by 5.8 cm craniocaudal. The right ovary is unremarkable. IUD is in place in the uterus. The patient has a tampon in place. Urinary bladder is unremarkable.  The gallbladder, liver, spleen, adrenal glands, kidneys, biliary tree and pancreas all appear normal. No lymphadenopathy is identified. Tiny amount of free pelvic fluid is consistent with physiologic change. No bony abnormality is identified.  IMPRESSION: Negative for appendicitis or other acute abnormality.  Left ovarian dermoid measuring a maximum of 5.8 cm in diameter.   Electronically Signed   By: Inge Rise M.D.   On: 01/19/2014 15:30     EKG Interpretation None      MDM   Final diagnoses:  None   Medications  sodium chloride 0.9 % bolus 1,000 mL (0 mLs Intravenous Stopped 01/19/14 1242)  acetaminophen (TYLENOL) tablet 325  mg (325 mg Oral Given 01/19/14 1035)  iohexol (OMNIPAQUE) 300 MG/ML solution 25 mL (25 mLs Oral Contrast Given 01/19/14 1132)  sodium chloride 0.9 % bolus 1,000 mL (1,000 mLs Intravenous New Bag/Given 01/19/14 1330)  ondansetron (ZOFRAN) injection 4 mg (4 mg Intravenous Given 01/19/14 1329)  iohexol (OMNIPAQUE) 300 MG/ML solution 100 mL (100 mLs Intravenous Contrast Given 01/19/14 1506)   Filed Vitals:   01/19/14 1120 01/19/14 1130 01/19/14 1330 01/19/14 1430  BP: 102/81 110/63 109/68 107/62  Pulse: 104 112 106 105  Temp: 99.8 F (37.7 C)     TempSrc: Oral     Resp: 18     SpO2: 100% 100% 100% 100%    CBC elevated white blood cell count of 14.7. BMP noted mildly low potassium of 3.3-hypokalemia noted. Kidneys functioning well. Lipase negative elevation. Rapid strep negative. Urinalysis noted large hemoglobin with leukocytes with white blood cells 11-20 many bacteria noted many squamous cells-not a clean catch. Urine pregnancy negative. Chest x-ray negative for acute cardiac pulmonary disease-negative pleural effusion, pneumonia, pneumothorax noted. CT abdomen and pelvis with contrast negative for appendicitis or other acute abnormalities - left ovarian dermoid measuring a maximum of 5.8 cm in diameter - patient is due to get surgery on this Friday, 01/27/2014.  US pelvis pending.  Discussed case with Wille Glaser, PA-C. Transfer of care to [facility], PA-C at change in shift.    Jamse Mead, PA-C 01/19/14 1817

## 2014-01-19 NOTE — ED Notes (Signed)
Pt unable to void at this time. 

## 2014-01-19 NOTE — Discharge Instructions (Signed)
Abdominal Pain, Women °Abdominal (stomach, pelvic, or belly) pain can be caused by many things. It is important to tell your doctor: °· The location of the pain. °· Does it come and go or is it present all the time? °· Are there things that start the pain (eating certain foods, exercise)? °· Are there other symptoms associated with the pain (fever, nausea, vomiting, diarrhea)? °All of this is helpful to know when trying to find the cause of the pain. °CAUSES  °· Stomach: virus or bacteria infection, or ulcer. °· Intestine: appendicitis (inflamed appendix), regional ileitis (Crohn's disease), ulcerative colitis (inflamed colon), irritable bowel syndrome, diverticulitis (inflamed diverticulum of the colon), or cancer of the stomach or intestine. °· Gallbladder disease or stones in the gallbladder. °· Kidney disease, kidney stones, or infection. °· Pancreas infection or cancer. °· Fibromyalgia (pain disorder). °· Diseases of the female organs: °· Uterus: fibroid (non-cancerous) tumors or infection. °· Fallopian tubes: infection or tubal pregnancy. °· Ovary: cysts or tumors. °· Pelvic adhesions (scar tissue). °· Endometriosis (uterus lining tissue growing in the pelvis and on the pelvic organs). °· Pelvic congestion syndrome (female organs filling up with blood just before the menstrual period). °· Pain with the menstrual period. °· Pain with ovulation (producing an egg). °· Pain with an IUD (intrauterine device, birth control) in the uterus. °· Cancer of the female organs. °· Functional pain (pain not caused by a disease, may improve without treatment). °· Psychological pain. °· Depression. °DIAGNOSIS  °Your doctor will decide the seriousness of your pain by doing an examination. °· Blood tests. °· X-rays. °· Ultrasound. °· CT scan (computed tomography, special type of X-ray). °· MRI (magnetic resonance imaging). °· Cultures, for infection. °· Barium enema (dye inserted in the large intestine, to better view it with  X-rays). °· Colonoscopy (looking in intestine with a lighted tube). °· Laparoscopy (minor surgery, looking in abdomen with a lighted tube). °· Major abdominal exploratory surgery (looking in abdomen with a large incision). °TREATMENT  °The treatment will depend on the cause of the pain.  °· Many cases can be observed and treated at home. °· Over-the-counter medicines recommended by your caregiver. °· Prescription medicine. °· Antibiotics, for infection. °· Birth control pills, for painful periods or for ovulation pain. °· Hormone treatment, for endometriosis. °· Nerve blocking injections. °· Physical therapy. °· Antidepressants. °· Counseling with a psychologist or psychiatrist. °· Minor or major surgery. °HOME CARE INSTRUCTIONS  °· Do not take laxatives, unless directed by your caregiver. °· Take over-the-counter pain medicine only if ordered by your caregiver. Do not take aspirin because it can cause an upset stomach or bleeding. °· Try a clear liquid diet (broth or water) as ordered by your caregiver. Slowly move to a bland diet, as tolerated, if the pain is related to the stomach or intestine. °· Have a thermometer and take your temperature several times a day, and record it. °· Bed rest and sleep, if it helps the pain. °· Avoid sexual intercourse, if it causes pain. °· Avoid stressful situations. °· Keep your follow-up appointments and tests, as your caregiver orders. °· If the pain does not go away with medicine or surgery, you may try: °· Acupuncture. °· Relaxation exercises (yoga, meditation). °· Group therapy. °· Counseling. °SEEK MEDICAL CARE IF:  °· You notice certain foods cause stomach pain. °· Your home care treatment is not helping your pain. °· You need stronger pain medicine. °· You want your IUD removed. °· You feel faint or   lightheaded.  You develop nausea and vomiting.  You develop a rash.  You are having side effects or an allergy to your medicine. SEEK IMMEDIATE MEDICAL CARE IF:   Your  pain does not go away or gets worse.  You have a fever.  Your pain is felt only in portions of the abdomen. The right side could possibly be appendicitis. The left lower portion of the abdomen could be colitis or diverticulitis.  You are passing blood in your stools (bright red or black tarry stools, with or without vomiting).  You have blood in your urine.  You develop chills, with or without a fever.  You pass out. MAKE SURE YOU:   Understand these instructions.  Will watch your condition.  Will get help right away if you are not doing well or get worse. Document Released: 06/29/2007 Document Revised: 11/24/2011 Document Reviewed: 07/19/2009 [facility] Patient Information 2014 [place], [place].  Urinary Tract Infection Urinary tract infections (UTIs) can develop anywhere along your urinary tract. Your urinary tract is your body's drainage system for removing wastes and extra water. Your urinary tract includes two kidneys, two ureters, a bladder, and a urethra. Your kidneys are a pair of bean-shaped organs. Each kidney is about the size of your fist. They are located below your ribs, one on each side of your spine. CAUSES Infections are caused by microbes, which are microscopic organisms, including fungi, viruses, and bacteria. These organisms are so small that they can only be seen through a microscope. Bacteria are the microbes that most commonly cause UTIs. SYMPTOMS  Symptoms of UTIs may vary by age and gender of the patient and by the location of the infection. Symptoms in young women typically include a frequent and intense urge to urinate and a painful, burning feeling in the bladder or urethra during urination. Older women and men are more likely to be tired, shaky, and weak and have muscle aches and abdominal pain. A fever may mean the infection is in your kidneys. Other symptoms of a kidney infection include pain in your back or sides below the ribs, nausea, and  vomiting. DIAGNOSIS To diagnose a UTI, your caregiver will ask you about your symptoms. Your caregiver also will ask to provide a urine sample. The urine sample will be tested for bacteria and white blood cells. White blood cells are made by your body to help fight infection. TREATMENT  Typically, UTIs can be treated with medication. Because most UTIs are caused by a bacterial infection, they usually can be treated with the use of antibiotics. The choice of antibiotic and length of treatment depend on your symptoms and the type of bacteria causing your infection. HOME CARE INSTRUCTIONS  If you were prescribed antibiotics, take them exactly as your caregiver instructs you. Finish the medication even if you feel better after you have only taken some of the medication.  Drink enough water and fluids to keep your urine clear or pale yellow.  Avoid caffeine, tea, and carbonated beverages. They tend to irritate your bladder.  Empty your bladder often. Avoid holding urine for long periods of time.  Empty your bladder before and after sexual intercourse.  After a bowel movement, women should cleanse from front to back. Use each tissue only once. SEEK MEDICAL CARE IF:   You have back pain.  You develop a fever.  Your symptoms do not begin to resolve within 3 days. SEEK IMMEDIATE MEDICAL CARE IF:   You have severe back pain or lower abdominal pain.  You develop chills.  You have nausea or vomiting.  You have continued burning or discomfort with urination. MAKE SURE YOU:   Understand these instructions.  Will watch your condition.  Will get help right away if you are not doing well or get worse. Document Released: 06/11/2005 Document Revised: 03/02/2012 Document Reviewed: 10/10/2011 [facility] Patient Information 2014 [place].

## 2014-01-19 NOTE — ED Notes (Signed)
Pt states she gets strep a lot but hasn't had since being here for school

## 2014-01-20 LAB — URINE CULTURE: Colony Count: 30000

## 2014-01-20 LAB — CULTURE, GROUP A STREP

## 2014-01-21 NOTE — ED Provider Notes (Signed)
Medical screening examination/treatment/procedure(s) were performed by non-physician practitioner and as supervising physician I was immediately available for consultation/collaboration.   EKG Interpretation None        Alfonzo Feller, DO 01/21/14 1452

## 2014-01-23 NOTE — ED Provider Notes (Signed)
Medical screening examination/treatment/procedure(s) were performed by non-physician practitioner and as supervising physician I was immediately available for consultation/collaboration.   EKG Interpretation None        Helina Hullum W. Teana Lindahl, MD 01/23/14 0231 

## 2014-06-17 ENCOUNTER — Emergency Department (HOSPITAL_COMMUNITY)
Admission: EM | Admit: 2014-06-17 | Discharge: 2014-06-17 | Disposition: A | Discharge status: Home / Self Care | Payer: 59 | Attending: Emergency Medicine | Admitting: Emergency Medicine

## 2014-06-17 ENCOUNTER — Encounter (HOSPITAL_COMMUNITY): Payer: Self-pay | Admitting: Emergency Medicine

## 2014-06-17 ENCOUNTER — Emergency Department (HOSPITAL_COMMUNITY): Payer: 59

## 2014-06-17 DIAGNOSIS — Z3202 Encounter for pregnancy test, result negative: Secondary | ICD-10-CM | POA: Diagnosis not present

## 2014-06-17 DIAGNOSIS — N760 Acute vaginitis: Secondary | ICD-10-CM | POA: Diagnosis not present

## 2014-06-17 DIAGNOSIS — Z880 Allergy status to penicillin: Secondary | ICD-10-CM | POA: Insufficient documentation

## 2014-06-17 DIAGNOSIS — J45909 Unspecified asthma, uncomplicated: Secondary | ICD-10-CM | POA: Diagnosis not present

## 2014-06-17 DIAGNOSIS — Z79899 Other long term (current) drug therapy: Secondary | ICD-10-CM | POA: Insufficient documentation

## 2014-06-17 DIAGNOSIS — R109 Unspecified abdominal pain: Secondary | ICD-10-CM | POA: Diagnosis present

## 2014-06-17 DIAGNOSIS — B9689 Other specified bacterial agents as the cause of diseases classified elsewhere: Secondary | ICD-10-CM

## 2014-06-17 DIAGNOSIS — A599 Trichomoniasis, unspecified: Secondary | ICD-10-CM

## 2014-06-17 DIAGNOSIS — A5901 Trichomonal vulvovaginitis: Secondary | ICD-10-CM | POA: Diagnosis not present

## 2014-06-17 LAB — CBC WITH DIFFERENTIAL/PLATELET
Basophils Absolute: 0 10*3/uL (ref 0.0–0.1)
Basophils Relative: 0 % (ref 0–1)
Eosinophils Absolute: 0 10*3/uL (ref 0.0–0.7)
Eosinophils Relative: 0 % (ref 0–5)
HEMATOCRIT: 41.1 % (ref 36.0–46.0)
HEMOGLOBIN: 13.6 g/dL (ref 12.0–15.0)
LYMPHS PCT: 15 % (ref 12–46)
Lymphs Abs: 1.2 10*3/uL (ref 0.7–4.0)
MCH: 28.4 pg (ref 26.0–34.0)
MCHC: 33.1 g/dL (ref 30.0–36.0)
MCV: 85.8 fL (ref 78.0–100.0)
MONO ABS: 0.4 10*3/uL (ref 0.1–1.0)
MONOS PCT: 5 % (ref 3–12)
NEUTROS PCT: 80 % — AB (ref 43–77)
Neutro Abs: 6.4 10*3/uL (ref 1.7–7.7)
Platelets: 332 10*3/uL (ref 150–400)
RBC: 4.79 MIL/uL (ref 3.87–5.11)
RDW: 12.6 % (ref 11.5–15.5)
WBC: 8 10*3/uL (ref 4.0–10.5)

## 2014-06-17 LAB — COMPREHENSIVE METABOLIC PANEL
ALK PHOS: 61 U/L (ref 39–117)
ALT: 21 U/L (ref 0–35)
AST: 23 U/L (ref 0–37)
Albumin: 4.1 g/dL (ref 3.5–5.2)
Anion gap: 15 (ref 5–15)
BUN: 9 mg/dL (ref 6–23)
CO2: 25 meq/L (ref 19–32)
CREATININE: 0.62 mg/dL (ref 0.50–1.10)
Calcium: 8.8 mg/dL (ref 8.4–10.5)
Chloride: 104 mEq/L (ref 96–112)
GFR calc Af Amer: >90 mL/min (ref >90)
Glucose, Bld: 97 mg/dL (ref 70–99)
POTASSIUM: 4.2 meq/L (ref 3.7–5.3)
Sodium: 144 mEq/L (ref 137–147)
Total Protein: 8.5 g/dL — ABNORMAL HIGH (ref 6.0–8.3)

## 2014-06-17 LAB — POC URINE PREG, ED: Preg Test, Ur: NEGATIVE

## 2014-06-17 LAB — URINE MICROSCOPIC-ADD ON

## 2014-06-17 LAB — URINALYSIS, ROUTINE W REFLEX MICROSCOPIC
Bilirubin Urine: NEGATIVE
GLUCOSE, UA: NEGATIVE mg/dL
Ketones, ur: NEGATIVE mg/dL
LEUKOCYTES UA: NEGATIVE
NITRITE: NEGATIVE
PH: 7.5 (ref 5.0–8.0)
Protein, ur: 30 mg/dL — AB
Specific Gravity, Urine: 1.024 (ref 1.005–1.030)
Urobilinogen, UA: 0.2 mg/dL (ref 0.0–1.0)

## 2014-06-17 LAB — WET PREP, GENITAL: YEAST WET PREP: NONE SEEN

## 2014-06-17 LAB — LIPASE, BLOOD: LIPASE: 19 U/L (ref 11–59)

## 2014-06-17 MED ORDER — METRONIDAZOLE 500 MG PO TABS: 500.0000 mg | ORAL_TABLET | Freq: Two times a day (BID) | ORAL | Status: AC

## 2014-06-17 MED ORDER — ONDANSETRON 4 MG PO TBDP: 4.0000 mg | ORAL_TABLET | Freq: Once | ORAL | Status: DC

## 2014-06-17 MED ORDER — SODIUM CHLORIDE 0.9 % IV BOLUS (SEPSIS)
1000.0000 mL | Freq: Once | INTRAVENOUS | Status: AC
  Administered 2014-06-17: 1000 mL via INTRAVENOUS

## 2014-06-17 MED ORDER — LIDOCAINE HCL (PF) 1 % IJ SOLN
2.0000 mL | Freq: Once | INTRAMUSCULAR | Status: AC
  Administered 2014-06-17: 1.2 mL
  Filled 2014-06-17: qty 5

## 2014-06-17 MED ORDER — METRONIDAZOLE 500 MG PO TABS
500.0000 mg | ORAL_TABLET | Freq: Once | ORAL | Status: AC
Start: 2014-06-17 — End: 2014-06-17
  Administered 2014-06-17: 500 mg via ORAL
  Filled 2014-06-17: qty 1

## 2014-06-17 MED ORDER — ONDANSETRON HCL 4 MG/2ML IJ SOLN
4.0000 mg | Freq: Once | INTRAMUSCULAR | Status: AC
  Administered 2014-06-17: 4 mg via INTRAVENOUS
  Filled 2014-06-17: qty 2

## 2014-06-17 MED ORDER — AZITHROMYCIN 250 MG PO TABS
1000.0000 mg | ORAL_TABLET | Freq: Once | ORAL | Status: AC
  Administered 2014-06-17: 1000 mg via ORAL
  Filled 2014-06-17: qty 4

## 2014-06-17 MED ORDER — ONDANSETRON 4 MG PO TBDP: 4.0000 mg | ORAL_TABLET | Freq: Three times a day (TID) | ORAL | Status: DC | PRN

## 2014-06-17 MED ORDER — KETOROLAC TROMETHAMINE 30 MG/ML IJ SOLN
30.0000 mg | Freq: Once | INTRAMUSCULAR | Status: AC
  Administered 2014-06-17: 30 mg via INTRAVENOUS
  Filled 2014-06-17: qty 1

## 2014-06-17 MED ORDER — CEFTRIAXONE SODIUM 250 MG IJ SOLR
250.0000 mg | Freq: Once | INTRAMUSCULAR | Status: AC
  Administered 2014-06-17: 250 mg via INTRAMUSCULAR
  Filled 2014-06-17: qty 250

## 2014-06-17 MED ORDER — IBUPROFEN 400 MG PO TABS: 400.0000 mg | ORAL_TABLET | Freq: Once | ORAL | Status: DC

## 2014-06-17 MED ORDER — SODIUM CHLORIDE 0.9 % IV BOLUS (SEPSIS)
1000.0000 mL | Freq: Once | INTRAVENOUS | Status: DC
Start: 2014-06-17 — End: 2014-06-17

## 2014-06-17 MED ORDER — ONDANSETRON HCL 4 MG/2ML IJ SOLN: 4.0000 mg | Freq: Once | INTRAMUSCULAR | Status: DC

## 2014-06-17 MED ORDER — METRONIDAZOLE 500 MG PO TABS: 2000.0000 mg | ORAL_TABLET | Freq: Once | ORAL | Status: DC

## 2014-06-17 MED ORDER — ONDANSETRON 4 MG PO TBDP: 8.0000 mg | ORAL_TABLET | Freq: Once | ORAL | Status: DC

## 2014-06-17 MED ORDER — SODIUM CHLORIDE 0.9 % IV BOLUS (SEPSIS)
500.0000 mL | Freq: Once | INTRAVENOUS | Status: AC
Start: 2014-06-17 — End: 2014-06-17
  Administered 2014-06-17: 500 mL via INTRAVENOUS

## 2014-06-17 NOTE — Discharge Instructions (Signed)
Bacterial Vaginosis Bacterial vaginosis is a vaginal infection that occurs when the normal balance of bacteria in the vagina is disrupted. It results from an overgrowth of certain bacteria. This is the most common vaginal infection in women of childbearing age. Treatment is important to prevent complications, especially in pregnant women, as it can cause a premature delivery. CAUSES  Bacterial vaginosis is caused by an increase in harmful bacteria that are normally present in smaller amounts in the vagina. Several different kinds of bacteria can cause bacterial vaginosis. However, the reason that the condition develops is not fully understood. RISK FACTORS Certain activities or behaviors can put you at an increased risk of developing bacterial vaginosis, including:  Having a new sex partner or multiple sex partners.  Douching.  Using an intrauterine device (IUD) for contraception. Women do not get bacterial vaginosis from toilet seats, bedding, swimming pools, or contact with objects around them. SIGNS AND SYMPTOMS  Some women with bacterial vaginosis have no signs or symptoms. Common symptoms include:  Grey vaginal discharge.  A fishlike odor with discharge, especially after sexual intercourse.  Itching or burning of the vagina and vulva.  Burning or pain with urination. DIAGNOSIS  Your health care provider will take a medical history and examine the vagina for signs of bacterial vaginosis. A sample of vaginal fluid may be taken. Your health care provider will look at this sample under a microscope to check for bacteria and abnormal cells. A vaginal pH test may also be done.  TREATMENT  Bacterial vaginosis may be treated with antibiotic medicines. These may be given in the form of a pill or a vaginal cream. A second round of antibiotics may be prescribed if the condition comes back after treatment.  HOME CARE INSTRUCTIONS   Only take over-the-counter or prescription medicines as  directed by your health care provider.  If antibiotic medicine was prescribed, take it as directed. Make sure you finish it even if you start to feel better.  Do not have sex until treatment is completed.  Tell all sexual partners that you have a vaginal infection. They should see their health care provider and be treated if they have problems, such as a mild rash or itching.  Practice safe sex by using condoms and only having one sex partner. SEEK MEDICAL CARE IF:   Your symptoms are not improving after 3 days of treatment.  You have increased discharge or pain.  You have a fever. MAKE SURE YOU:   Understand these instructions.  Will watch your condition.  Will get help right away if you are not doing well or get worse. FOR MORE INFORMATION  Centers for Disease Control and Prevention, Division of STD Prevention: www.cdc.gov/std American Sexual Health Association (ASHA): www.ashastd.org  Document Released: 09/01/2005 Document Revised: 06/22/2013 Document Reviewed: 04/13/2013 [employer] Patient Information 2015 [employer]. This information is not intended to replace advice given to you by your health care provider. Make sure you discuss any questions you have with your health care provider.  

## 2014-06-17 NOTE — ED Notes (Signed)
Dr. Kathi Simpers completed the GC/Chlamydia Probe Amp

## 2014-06-17 NOTE — ED Provider Notes (Signed)
CSN: [id number]     Arrival date & time 06/17/14  14 History   First MD Initiated Contact with Patient 06/17/14 1651     Chief Complaint  Patient presents with  . Emesis  . Abdominal Pain   Patient is a 23 y.o. female presenting with vomiting and abdominal pain. The history is provided by the patient.  Emesis Severity:  Moderate Duration:  2 days Timing:  Sporadic Associated symptoms: abdominal pain   Associated symptoms: no headaches and no sore throat   Abdominal pain:    Location:  Epigastric and LLQ   Pain quality: "it just hurts"   Severity:  Moderate   Onset quality:  Gradual   Duration:  2 days   Timing:  Constant   Chronicity:  New Abdominal Pain Associated symptoms: nausea and vomiting   Associated symptoms: no chest pain, no constipation, no dysuria, no fever, no hematuria, no shortness of breath and no sore throat    23 year old [place] American female with a history of left ovarian cyst status post laparoscopic removal who presents with abdominal pain and emesis. The patient states she has had symptoms since yesterday. Patient denies emesis, diarrhea, constipation, reflux, fever. Patient denies vaginal bleeding or discharge. Nothing makes the pain better or worse.  Past Medical History  Diagnosis Date  . Asthma   . Herpes genitalia   . Ovarian cyst    Past Surgical History  Procedure Laterality Date  . Eye muscle surgery     Family History  Problem Relation Age of Onset  . Asthma Father   . Asthma Sister   . Asthma Brother   . Asthma Paternal Aunt   . Asthma Paternal Grandmother    History  Substance Use Topics  . Smoking status: Never Smoker   . Smokeless tobacco: Not on file  . Alcohol Use: No   OB History   Grav Para Term Preterm Abortions TAB SAB Ect Mult Living   0              Review of Systems  Constitutional: Negative for fever.  HENT: Negative for rhinorrhea and sore throat.   Eyes: Negative for visual disturbance.  Respiratory:  Negative for chest tightness and shortness of breath.   Cardiovascular: Negative for chest pain and palpitations.  Gastrointestinal: Positive for nausea, vomiting and abdominal pain. Negative for constipation.  Genitourinary: Negative for dysuria and hematuria.  Musculoskeletal: Negative for back pain and neck pain.  Skin: Negative for rash.  Neurological: Negative for dizziness and headaches.  Psychiatric/Behavioral: Negative for confusion.  All other systems reviewed and are negative.  Allergies  Penicillins  Home Medications   Prior to Admission medications   Medication Sig Start Date End Date Taking? Authorizing Provider  albuterol (PROVENTIL HFA;VENTOLIN HFA) 108 (90 BASE) MCG/ACT inhaler Inhale 2 puffs into the lungs every 6 (six) hours as needed for wheezing or shortness of breath.   Yes Historical Provider, MD  ibuprofen (ADVIL,MOTRIN) 600 MG tablet Take 600 mg by mouth every 6 (six) hours as needed for mild pain. 11/23/13  Yes Deirdre Freida Busman, CNM  levonorgestrel (MIRENA) 20 MCG/24HR IUD 1 each by Intrauterine route once. January 2015   Yes Historical Provider, MD  valACYclovir (VALTREX) 500 MG tablet Take 500 mg by mouth daily.   Yes Historical Provider, MD  metroNIDAZOLE (FLAGYL) 500 MG tablet Take 1 tablet (500 mg total) by mouth 2 (two) times daily. 06/18/14 06/23/14  Gustavus Bryant, MD  ondansetron (ZOFRAN ODT) 4 MG disintegrating  tablet Take 1 tablet (4 mg total) by mouth every 8 (eight) hours as needed for nausea or vomiting. 06/17/14   Gustavus Bryant, MD   BP 104/60  Pulse 110  Temp(Src) 98.6 F (37 C) (Oral)  Resp 18  SpO2 100% Physical Exam  Constitutional: She is oriented to person, place, and time. She appears well-developed and well-nourished. No distress.  HENT:  Head: Normocephalic and atraumatic.  Mouth/Throat: Oropharynx is clear and moist.  Eyes: EOM are normal. Pupils are equal, round, and reactive to light.  Neck: Neck supple. No JVD present.  Cardiovascular:  Normal rate, regular rhythm, normal heart sounds and intact distal pulses.  Exam reveals no gallop.   No murmur heard. Pulmonary/Chest: Effort normal and breath sounds normal. She has no wheezes. She has no rales.  Abdominal: Soft. She exhibits no distension. There is tenderness in the epigastric area and left lower quadrant. There is no rigidity, no rebound, no guarding and no CVA tenderness.  Genitourinary: Cervix exhibits no motion tenderness and no discharge. Right adnexum displays no mass, no tenderness and no fullness. Left adnexum displays tenderness. Left adnexum displays no mass and no fullness.  Musculoskeletal: Normal range of motion. She exhibits no tenderness.  Neurological: She is alert and oriented to person, place, and time. No cranial nerve deficit. She exhibits normal muscle tone.  Skin: Skin is warm and dry. No rash noted.  Psychiatric: Her behavior is normal.    ED Course  Procedures  None   Labs Review Labs Reviewed  WET PREP, GENITAL - Abnormal; Notable for the following:    Trich, Wet Prep TOO NUMEROUS TO COUNT (*)    Clue Cells Wet Prep HPF POC MODERATE (*)    WBC, Wet Prep HPF POC FEW (*)    All other components within normal limits  URINALYSIS, ROUTINE W REFLEX MICROSCOPIC - Abnormal; Notable for the following:    Hgb urine dipstick SMALL (*)    Protein, ur 30 (*)    All other components within normal limits  CBC WITH DIFFERENTIAL - Abnormal; Notable for the following:    Neutrophils Relative % 80 (*)    All other components within normal limits  COMPREHENSIVE METABOLIC PANEL - Abnormal; Notable for the following:    Total Protein 8.5 (*)    Total Bilirubin <0.2 (*)    All other components within normal limits  URINE MICROSCOPIC-ADD ON - Abnormal; Notable for the following:    Squamous Epithelial / LPF FEW (*)    Bacteria, UA FEW (*)    All other components within normal limits  GC/CHLAMYDIA PROBE AMP  LIPASE, BLOOD  RPR  HIV ANTIBODY (ROUTINE  TESTING)  POC URINE PREG, ED   Imaging Review US Transvaginal Non-ob  06/17/2014   CLINICAL DATA:  Emesis with abdominal and pelvic pain ; history of dermoid removal in May of 2015  EXAM: TRANSABDOMINAL AND TRANSVAGINAL ULTRASOUND OF PELVIS  DOPPLER ULTRASOUND OF OVARIES  TECHNIQUE: Both transabdominal and transvaginal ultrasound examinations of the pelvis were performed. Transabdominal technique was performed for global imaging of the pelvis including uterus, ovaries, adnexal regions, and pelvic cul-de-sac.  It was necessary to proceed with endovaginal exam following the transabdominal exam to visualize the endometrium and ovaries. Color and duplex Doppler ultrasound was utilized to evaluate blood flow to the ovaries.  COMPARISON:  Pelvic ultrasound dated Jan 19, 2014  FINDINGS: Uterus  Measurements: 5.2 x 3.1 x 4.1 cm. No fibroids or other mass visualized.  Endometrium  Thickness: 6.9  mm.  An IUD is in place.  Right ovary  Measurements: 4.2 x 2.4 x 3.1 cm. There are multiple developing follicles.  Left ovary  Measurements: 3.0 x 1.7 x 3.1 cm. There are multiple developing follicles.  Pulsed Doppler evaluation of both ovaries demonstrates normal low-resistance arterial and venous waveforms.  Other findings  No free fluid.  IMPRESSION: 1. The ovaries exhibit normal echotexture and vascularity. 2. The uterus is normal in size and contour. An IUD is in place. The endometrial stripe is normal. 3. There is no free fluid in the pelvis.   Electronically Signed   By: David  [place]   On: 06/17/2014 18:55   US Pelvis Complete  06/17/2014   CLINICAL DATA:  Emesis with abdominal and pelvic pain ; history of dermoid removal in May of 2015  EXAM: [place] ULTRASOUND OF OVARIES  TECHNIQUE: Both transabdominal and transvaginal ultrasound examinations of the pelvis were performed. Transabdominal technique was performed for global imaging of the pelvis including uterus,  ovaries, adnexal regions, and pelvic cul-de-sac.  It was necessary to proceed with endovaginal exam following the transabdominal exam to visualize the endometrium and ovaries. Color and duplex Doppler ultrasound was utilized to evaluate blood flow to the ovaries.  COMPARISON:  Pelvic ultrasound dated Jan 19, 2014  FINDINGS: Uterus  Measurements: 5.2 x 3.1 x 4.1 cm. No fibroids or other mass visualized.  Endometrium  Thickness: 6.9 mm.  An IUD is in place.  Right ovary  Measurements: 4.2 x 2.4 x 3.1 cm. There are multiple developing follicles.  Left ovary  Measurements: 3.0 x 1.7 x 3.1 cm. There are multiple developing follicles.  Pulsed Doppler evaluation of both ovaries demonstrates normal low-resistance arterial and venous waveforms.  Other findings  No free fluid.  IMPRESSION: 1. The ovaries exhibit normal echotexture and vascularity. 2. The uterus is normal in size and contour. An IUD is in place. The endometrial stripe is normal. 3. There is no free fluid in the pelvis.   Electronically Signed   By: David  [place]   On: 06/17/2014 18:55   [place] Art/ven Flow Abd Pelv Doppler  06/17/2014   CLINICAL DATA:  Emesis with abdominal and pelvic pain ; history of dermoid removal in May of 2015  EXAM: [place] ULTRASOUND OF OVARIES  TECHNIQUE: Both transabdominal and transvaginal ultrasound examinations of the pelvis were performed. Transabdominal technique was performed for global imaging of the pelvis including uterus, ovaries, adnexal regions, and pelvic cul-de-sac.  It was necessary to proceed with endovaginal exam following the transabdominal exam to visualize the endometrium and ovaries. Color and duplex Doppler ultrasound was utilized to evaluate blood flow to the ovaries.  COMPARISON:  Pelvic ultrasound dated Jan 19, 2014  FINDINGS: Uterus  Measurements: 5.2 x 3.1 x 4.1 cm. No fibroids or other mass visualized.  Endometrium  Thickness: 6.9 mm.  An IUD is in place.   Right ovary  Measurements: 4.2 x 2.4 x 3.1 cm. There are multiple developing follicles.  Left ovary  Measurements: 3.0 x 1.7 x 3.1 cm. There are multiple developing follicles.  Pulsed Doppler evaluation of both ovaries demonstrates normal low-resistance arterial and venous waveforms.  Other findings  No free fluid.  IMPRESSION: 1. The ovaries exhibit normal echotexture and vascularity. 2. The uterus is normal in size and contour. An IUD is in place. The endometrial stripe is normal. 3. There is no free fluid in the pelvis.  Electronically Signed   By: David  [place]   On: 06/17/2014 18:55     MDM   Final diagnoses:  BV (bacterial vaginosis)  Trichomoniasis    23 year old female presents with abdominal pain and emesis. Patient is well appearing on exam. She is tachycardic to the 1 teens. Her abdomen is tender to palpation in the epigastrium and left lower quadrant. Pelvic exam is insignificant with no discharge or cervical lesions. She does not have CMT but does have left adnexal tenderness. Will proceed with a pelvic ultrasound to further evaluate her adnexa. Pelvic US neg for free fluid or other pelvic pathology.  We'll also give normal saline bolus for tachycardia. Patient's wet prep shows 2 numerous to count Trichomonas as well as clue cells. UA not concerning for UTI.  Remainder of labs unremarkable. Will give IM Rocephin, flagyl and azithromycin 1g po. Will d/c with zofran and flagyl for BV. Patient instructed to f/u with PCP in 1-2 weeks. She voiced understanding and is agreeable with plan.   Case discussed with Dr. Darl Householder.   Gustavus Bryant, MD 06/17/14 2021

## 2014-06-17 NOTE — ED Provider Notes (Signed)
I saw and evaluated the patient, reviewed the resident's note and I agree with the findings and plan.   EKG Interpretation None      Leah [name] is a 23 y.o. female hx of L ovarian cyst here with vomiting, ab pain. Vomiting, epigastric pain since yesterday. No diarrhea. On exam, mild epigastric tenderness. Resident performed pelvic exam and was concerned for L adnexal tenderness. US showed no obvious torsion. Labs unremarkable. Tolerated PO. Has wet prep + BV and trich. Will d/c home with flagyl, zofran.    Wandra Arthurs, MD 06/17/14 2028

## 2014-06-17 NOTE — ED Notes (Signed)
Pt presents to department for evaluation of diffuse abdominal pain and nausea/vomiting. Pt is alert and oriented x4. NAD.

## 2014-06-18 LAB — HIV ANTIBODY (ROUTINE TESTING W REFLEX): HIV 1&2 Ab, 4th Generation: NONREACTIVE

## 2014-06-18 LAB — RPR

## 2014-06-19 LAB — GC/CHLAMYDIA PROBE AMP
CT Probe RNA: NEGATIVE
GC PROBE AMP APTIMA: NEGATIVE

## 2014-10-02 ENCOUNTER — Encounter (HOSPITAL_COMMUNITY): Payer: Self-pay | Admitting: Emergency Medicine

## 2014-10-02 ENCOUNTER — Emergency Department (HOSPITAL_COMMUNITY)
Admission: EM | Admit: 2014-10-02 | Discharge: 2014-10-02 | Disposition: A | Discharge status: Home / Self Care | Payer: 59 | Attending: Emergency Medicine | Admitting: Emergency Medicine

## 2014-10-02 DIAGNOSIS — Y9289 Other specified places as the place of occurrence of the external cause: Secondary | ICD-10-CM | POA: Diagnosis not present

## 2014-10-02 DIAGNOSIS — Z8742 Personal history of other diseases of the female genital tract: Secondary | ICD-10-CM | POA: Diagnosis not present

## 2014-10-02 DIAGNOSIS — Y9389 Activity, other specified: Secondary | ICD-10-CM | POA: Insufficient documentation

## 2014-10-02 DIAGNOSIS — Z880 Allergy status to penicillin: Secondary | ICD-10-CM | POA: Diagnosis not present

## 2014-10-02 DIAGNOSIS — S29012A Strain of muscle and tendon of back wall of thorax, initial encounter: Secondary | ICD-10-CM | POA: Diagnosis not present

## 2014-10-02 DIAGNOSIS — S46819A Strain of other muscles, fascia and tendons at shoulder and upper arm level, unspecified arm, initial encounter: Secondary | ICD-10-CM

## 2014-10-02 DIAGNOSIS — M546 Pain in thoracic spine: Secondary | ICD-10-CM | POA: Diagnosis present

## 2014-10-02 DIAGNOSIS — X58XXXA Exposure to other specified factors, initial encounter: Secondary | ICD-10-CM | POA: Diagnosis not present

## 2014-10-02 DIAGNOSIS — J45909 Unspecified asthma, uncomplicated: Secondary | ICD-10-CM | POA: Diagnosis not present

## 2014-10-02 DIAGNOSIS — Z79899 Other long term (current) drug therapy: Secondary | ICD-10-CM | POA: Insufficient documentation

## 2014-10-02 DIAGNOSIS — Z8619 Personal history of other infectious and parasitic diseases: Secondary | ICD-10-CM | POA: Insufficient documentation

## 2014-10-02 DIAGNOSIS — Z3202 Encounter for pregnancy test, result negative: Secondary | ICD-10-CM | POA: Diagnosis not present

## 2014-10-02 DIAGNOSIS — M549 Dorsalgia, unspecified: Secondary | ICD-10-CM

## 2014-10-02 DIAGNOSIS — Z791 Long term (current) use of non-steroidal anti-inflammatories (NSAID): Secondary | ICD-10-CM | POA: Insufficient documentation

## 2014-10-02 DIAGNOSIS — Y998 Other external cause status: Secondary | ICD-10-CM | POA: Diagnosis not present

## 2014-10-02 LAB — PREGNANCY, URINE: Preg Test, Ur: NEGATIVE

## 2014-10-02 MED ORDER — TRAMADOL HCL 50 MG PO TABS: 50.0000 mg | ORAL_TABLET | Freq: Two times a day (BID) | ORAL | Status: DC | PRN

## 2014-10-02 MED ORDER — MELOXICAM 15 MG PO TABS
15.0000 mg | ORAL_TABLET | Freq: Every day | ORAL | Status: DC
  Administered 2014-10-02: 15 mg via ORAL
  Filled 2014-10-02: qty 1

## 2014-10-02 MED ORDER — METHOCARBAMOL 500 MG PO TABS: 500.0000 mg | ORAL_TABLET | Freq: Two times a day (BID) | ORAL | Status: DC | PRN

## 2014-10-02 MED ORDER — MELOXICAM 7.5 MG PO TABS: 15.0000 mg | ORAL_TABLET | Freq: Every day | ORAL | Status: DC

## 2014-10-02 NOTE — Discharge Instructions (Signed)
Muscle Strain A muscle strain is an injury that occurs when a muscle is stretched beyond its normal length. Usually a small number of muscle fibers are torn when this happens. Muscle strain is rated in degrees. First-degree strains have the least amount of muscle fiber tearing and pain. Second-degree and third-degree strains have increasingly more tearing and pain.  Usually, recovery from muscle strain takes 1-2 weeks. Complete healing takes 5-6 weeks.  CAUSES  Muscle strain happens when a sudden, violent force placed on a muscle stretches it too far. This may occur with lifting, sports, or a fall.  RISK FACTORS Muscle strain is especially common in athletes.  SIGNS AND SYMPTOMS At the site of the muscle strain, there may be:  Pain.  Bruising.  Swelling.  Difficulty using the muscle due to pain or lack of normal function. DIAGNOSIS  Your health care provider will perform a physical exam and ask about your medical history. TREATMENT  Often, the best treatment for a muscle strain is resting, icing, and applying cold compresses to the injured area.  HOME CARE INSTRUCTIONS   Use the PRICE method of treatment to promote muscle healing during the first 2-3 days after your injury. The PRICE method involves:  Protecting the muscle from being injured again.  Restricting your activity and resting the injured body part.  Icing your injury. To do this, put ice in a plastic bag. Place a towel between your skin and the bag. Then, apply the ice and leave it on from 15-20 minutes each hour. After the third day, switch to moist heat packs.  Apply compression to the injured area with a splint or elastic bandage. Be careful not to wrap it too tightly. This may interfere with blood circulation or increase swelling.  Elevate the injured body part above the level of your heart as often as you can.  Only take over-the-counter or prescription medicines for pain, discomfort, or fever as directed by your  health care provider.  Warming up prior to exercise helps to prevent future muscle strains. SEEK MEDICAL CARE IF:   You have increasing pain or swelling in the injured area.  You have numbness, tingling, or a significant loss of strength in the injured area. MAKE SURE YOU:   Understand these instructions.  Will watch your condition.  Will get help right away if you are not doing well or get worse. Document Released: 09/01/2005 Document Revised: 06/22/2013 Document Reviewed: 03/31/2013 [facility] Patient Information 2015 [place], [place]. This information is not intended to replace advice given to you by your health care provider. Make sure you discuss any questions you have with your health care provider.  Back Exercises Back exercises help treat and prevent back injuries. The goal of back exercises is to increase the strength of your abdominal and back muscles and the flexibility of your back. These exercises should be started when you no longer have back pain. Back exercises include:  Pelvic Tilt. Lie on your back with your knees bent. Tilt your pelvis until the lower part of your back is against the floor. Hold this position 5 to 10 sec and repeat 5 to 10 times.  Knee to Chest. Pull first 1 knee up against your chest and hold for 20 to 30 seconds, repeat this with the other knee, and then both knees. This may be done with the other leg straight or bent, whichever feels better.  Sit-Ups or Curl-Ups. Bend your knees 90 degrees. Start with tilting your pelvis, and do a  partial, slow sit-up, lifting your trunk only 30 to 45 degrees off the floor. Take at least 2 to 3 seconds for each sit-up. Do not do sit-ups with your knees out straight. If partial sit-ups are difficult, simply do the above but with only tightening your abdominal muscles and holding it as directed.  Hip-Lift. Lie on your back with your knees flexed 90 degrees. Push down with your feet and shoulders as you raise your hips a  couple inches off the floor; hold for 10 seconds, repeat 5 to 10 times.  Back arches. Lie on your stomach, propping yourself up on bent elbows. Slowly press on your hands, causing an arch in your low back. Repeat 3 to 5 times. Any initial stiffness and discomfort should lessen with repetition over time.  Shoulder-Lifts. Lie face down with arms beside your body. Keep hips and torso pressed to floor as you slowly lift your head and shoulders off the floor. Do not overdo your exercises, especially in the beginning. Exercises may cause you some mild back discomfort which lasts for a few minutes; however, if the pain is more severe, or lasts for more than 15 minutes, do not continue exercises until you see your caregiver. Improvement with exercise therapy for back problems is slow.  See your caregivers for assistance with developing a proper back exercise program. Document Released: 10/09/2004 Document Revised: 11/24/2011 Document Reviewed: 07/03/2011 [facility] Patient Information 2015 [place], [place]. This information is not intended to replace advice given to you by your health care provider. Make sure you discuss any questions you have with your health care provider.

## 2014-10-02 NOTE — ED Notes (Signed)
Pt arrived to the ED with a complaint of upper back pain.  Pt states pain began last Monday.  Pt states pain is located in both shoulders and lower neck area.

## 2014-10-02 NOTE — ED Provider Notes (Signed)
CSN: [id number]     Arrival date & time 10/02/14  0006 History   First MD Initiated Contact with Patient 10/02/14 0009     Chief Complaint  Patient presents with  . Back Pain    (Consider location/radiation/quality/duration/timing/severity/associated sxs/prior Treatment) HPI Comments: 24 year old [name] since to the emergency department for further evaluation of upper back pain. Patient states that she has noticed worsening in her symptoms over the past week. Patient states that she has not had any direct trauma or injury to her back; however, she works at [employer] where she has to do frequent lifting and repetitive movements. She states that she notices worsening in her symptoms after prolonged periods of rest. She has been taking ibuprofen and Aleve without relief. No associated fever, cough, shortness of breath, incontinence, extremity numbness or weakness.  Patient is a 24 y.o. [name] presenting with back pain. The history is provided by the patient. No language interpreter was used.  Back Pain Associated symptoms: no numbness and no weakness     Past Medical History  Diagnosis Date  . Asthma   . Herpes genitalia   . Ovarian cyst    Past Surgical History  Procedure Laterality Date  . Eye muscle surgery     Family History  Problem Relation Age of Onset  . Asthma Father   . Asthma Sister   . Asthma Brother   . Asthma Paternal Aunt   . Asthma Paternal Grandmother    History  Substance Use Topics  . Smoking status: Never Smoker   . Smokeless tobacco: Not on file  . Alcohol Use: No   OB History    Gravida Para Term Preterm AB TAB SAB Ectopic Multiple Living   0               Review of Systems  Respiratory: Negative for cough and shortness of breath.   Musculoskeletal: Positive for myalgias, back pain and neck pain.  Neurological: Negative for weakness and numbness.  All other systems reviewed and are negative.   Allergies  Penicillins  Home  Medications   Prior to Admission medications   Medication Sig Start Date End Date Taking? Authorizing Provider  albuterol (PROVENTIL HFA;VENTOLIN HFA) 108 (90 BASE) MCG/ACT inhaler Inhale 2 puffs into the lungs every 6 (six) hours as needed for wheezing or shortness of breath.   Yes Historical Provider, MD  ibuprofen (ADVIL,MOTRIN) 800 MG tablet Take 800 mg by mouth every 8 (eight) hours as needed for moderate pain.   Yes Historical Provider, MD  levonorgestrel (MIRENA) 20 MCG/24HR IUD 1 each by Intrauterine route once. January 2015   Yes Historical Provider, MD  Multiple Vitamins-Minerals (HAIR VITAMINS PO) Take 2 tablets by mouth daily.   Yes Historical Provider, MD  naproxen sodium (ANAPROX) 220 MG tablet Take 220 mg by mouth 2 (two) times daily as needed (pain).   Yes Historical Provider, MD  valACYclovir (VALTREX) 500 MG tablet Take 500 mg by mouth daily.   Yes Historical Provider, MD  meloxicam (MOBIC) 7.5 MG tablet Take 2 tablets (15 mg total) by mouth daily. 10/02/14   Antonietta Breach, PA-C  methocarbamol (ROBAXIN) 500 MG tablet Take 1 tablet (500 mg total) by mouth 2 (two) times daily as needed for muscle spasms. 10/02/14   Antonietta Breach, PA-C  ondansetron (ZOFRAN ODT) 4 MG disintegrating tablet Take 1 tablet (4 mg total) by mouth every 8 (eight) hours as needed for nausea or vomiting. Patient not taking: Reported on 10/02/2014 06/17/14  Leah Bryant, MD  traMADol (ULTRAM) 50 MG tablet Take 1 tablet (50 mg total) by mouth every 12 (twelve) hours as needed for severe pain. 10/02/14   Antonietta Breach, PA-C   BP 110/61 mmHg  Pulse 81  Temp(Src) 98.3 F (36.8 C) (Oral)  Resp 16  Ht 5\' 7"  (1.702 m)  Wt 148 lb (67.132 kg)  BMI 23.Leah kg/m2  SpO2 100%  LMP 09/01/2014 (Exact Date)   Physical Exam  Constitutional: She is oriented to person, place, and time. She appears well-developed and well-nourished. No distress.  Nontoxic/nonseptic appearing  HENT:  Head: Normocephalic and atraumatic.  Eyes:  Conjunctivae and EOM are normal. No scleral icterus.  Neck: Normal range of motion.  No TTP to the cervical midline. No bony deformities, step offs, or crepitus.  Cardiovascular: Normal rate, regular rhythm and intact distal pulses.   Distal radial pulse 2+ b/l  Pulmonary/Chest: Effort normal. No respiratory distress.  Respirations even and unlabored  Musculoskeletal: Normal range of motion. She exhibits tenderness.  Tenderness to palpation along the course of the bilateral trapezius muscle. Mild spasm appreciated on the right. No bony deformities, step-offs, or crepitus noted to the thoracic or lumbar midline. No midline tenderness appreciated.  Neurological: She is alert and oriented to person, place, and time. She exhibits normal muscle tone. Coordination normal.  GCS 15. Patient ambulatory with steady gait. Equal grip strength bilaterally. Strength 5/5 against resistance in bilateral upper extremities.  Skin: Skin is warm and dry. No rash noted. She is not diaphoretic. No erythema. No pallor.  Psychiatric: She has a normal mood and affect. Her behavior is normal.  Nursing note and vitals reviewed.   ED Course  Procedures (including critical care time) Labs Review Labs Reviewed  PREGNANCY, URINE    Imaging Review No results found.   EKG Interpretation None      MDM   Final diagnoses:  Trapezius strain, unspecified laterality, initial encounter  Upper back pain    24 year old [name] returns to the emergency department for further evaluation of upper back pain. No history of direct trauma or injury to the area. Patient does frequent repetitive movements and lifting at work. Symptoms secondary to muscle strain secondary to occupational overuse. Patient is neurovascularly intact. No bony tenderness, crepitus, or step offs. No indication for further emergent workup or imaging. Patient to be discharged with course of Mobic and Robaxin. Have also prescribed tramadol to take as  needed for severe pain. Return precautions discussed and provided. Patient agreeable to plan with no unaddressed concerns.   Filed Vitals:   10/02/14 0013  BP: 110/61  Pulse: 81  Temp: 98.3 F (36.8 C)  TempSrc: Oral  Resp: 16  Height: 5\' 7"  (1.702 m)  Weight: 148 lb (67.132 kg)  SpO2: 100%     Antonietta Breach, PA-C 10/02/14 0127  Julianne Rice, MD 10/02/14 [phone]

## 2014-10-26 ENCOUNTER — Emergency Department (HOSPITAL_COMMUNITY)
Admission: EM | Admit: 2014-10-26 | Discharge: 2014-10-26 | Disposition: A | Discharge status: Home / Self Care | Payer: 59 | Attending: Emergency Medicine | Admitting: Emergency Medicine

## 2014-10-26 ENCOUNTER — Encounter (HOSPITAL_COMMUNITY): Payer: Self-pay | Admitting: Emergency Medicine

## 2014-10-26 DIAGNOSIS — Z79899 Other long term (current) drug therapy: Secondary | ICD-10-CM | POA: Insufficient documentation

## 2014-10-26 DIAGNOSIS — Z8742 Personal history of other diseases of the female genital tract: Secondary | ICD-10-CM | POA: Diagnosis not present

## 2014-10-26 DIAGNOSIS — Z8619 Personal history of other infectious and parasitic diseases: Secondary | ICD-10-CM | POA: Insufficient documentation

## 2014-10-26 DIAGNOSIS — Z880 Allergy status to penicillin: Secondary | ICD-10-CM | POA: Insufficient documentation

## 2014-10-26 DIAGNOSIS — M62838 Other muscle spasm: Secondary | ICD-10-CM | POA: Insufficient documentation

## 2014-10-26 DIAGNOSIS — Z791 Long term (current) use of non-steroidal anti-inflammatories (NSAID): Secondary | ICD-10-CM | POA: Insufficient documentation

## 2014-10-26 DIAGNOSIS — J45909 Unspecified asthma, uncomplicated: Secondary | ICD-10-CM | POA: Insufficient documentation

## 2014-10-26 DIAGNOSIS — M25511 Pain in right shoulder: Secondary | ICD-10-CM | POA: Diagnosis present

## 2014-10-26 MED ORDER — CYCLOBENZAPRINE HCL 10 MG PO TABS: 10.0000 mg | ORAL_TABLET | Freq: Two times a day (BID) | ORAL | Status: DC | PRN

## 2014-10-26 NOTE — Discharge Instructions (Signed)
For pain control you may take up to 800mg  of ibuprofen (that is usually 4 over the counter pills)  3 times a day (take with food) and acetaminophen 975mg  (this is 3 over the counter pills) four times a day. Do not drink alcohol or combine with other medications that have acetaminophen as an ingredient (Read the labels!).    For breakthrough pain you may take Flexeril. Do not drink alcohol, drive or operate heavy machinery when taking Flexeril.  Do not hesitate to return to the emergency room for any new, worsening or concerning symptoms.  Please obtain primary care using resource guide below. But the minute you were seen in the emergency room and that they will need to obtain records for further outpatient management.   Muscle Cramps and Spasms Muscle cramps and spasms occur when a muscle or muscles tighten and you have no control over this tightening (involuntary muscle contraction). They are a common problem and can develop in any muscle. The most common place is in the calf muscles of the leg. Both muscle cramps and muscle spasms are involuntary muscle contractions, but they also have differences:   Muscle cramps are sporadic and painful. They may last a few seconds to a quarter of an hour. Muscle cramps are often more forceful and last longer than muscle spasms.  Muscle spasms may or may not be painful. They may also last just a few seconds or much longer. CAUSES  It is uncommon for cramps or spasms to be due to a serious underlying problem. In many cases, the cause of cramps or spasms is unknown. Some common causes are:   Overexertion.   Overuse from repetitive motions (doing the same thing over and over).   Remaining in a certain position for a long period of time.   Improper preparation, form, or technique while performing a sport or activity.   Dehydration.   Injury.   Side effects of some medicines.   Abnormally low levels of the salts and ions in your blood  (electrolytes), especially potassium and calcium. This could happen if you are taking water pills (diuretics) or you are pregnant.  Some underlying medical problems can make it more likely to develop cramps or spasms. These include, but are not limited to:   Diabetes.   Parkinson disease.   Hormone disorders, such as thyroid problems.   Alcohol abuse.   Diseases specific to muscles, joints, and bones.   Blood vessel disease where not enough blood is getting to the muscles.  HOME CARE INSTRUCTIONS   Stay well hydrated. Drink enough water and fluids to keep your urine clear or pale yellow.  It may be helpful to massage, stretch, and relax the affected muscle.  For tight or tense muscles, use a warm towel, heating pad, or hot shower water directed to the affected area.  If you are sore or have pain after a cramp or spasm, applying ice to the affected area may relieve discomfort.  Put ice in a plastic bag.  Place a towel between your skin and the bag.  Leave the ice on for 15-20 minutes, 03-04 times a day.  Medicines used to treat a known cause of cramps or spasms may help reduce their frequency or severity. Only take over-the-counter or prescription medicines as directed by your caregiver. SEEK MEDICAL CARE IF:  Your cramps or spasms get more severe, more frequent, or do not improve over time.  MAKE SURE YOU:   Understand these instructions.  Will watch  your condition.  Will get help right away if you are not doing well or get worse. Document Released: 02/21/2002 Document Revised: 12/27/2012 Document Reviewed: 08/18/2012 [facility] Patient Information 2015 [place], [place]. This information is not intended to replace advice given to you by your health care provider. Make sure you discuss any questions you have with your health care provider.   Emergency Department Resource Guide 1) Find a Doctor and Pay Out of Pocket Although you won't have to find out who is covered by  your insurance plan, it is a good idea to ask around and get recommendations. You will then need to call the office and see if the doctor you have chosen will accept you as a new patient and what types of options they offer for patients who are self-pay. Some doctors offer discounts or will set up payment plans for their patients who do not have insurance, but you will need to ask so you aren't surprised when you get to your appointment.  2) Contact Your Local Health Department Not all health departments have doctors that can see patients for sick visits, but many do, so it is worth a call to see if yours does. If you don't know where your local health department is, you can check in your phone book. The CDC also has a tool to help you locate your state's health department, and many state websites also have listings of all of their local health departments.  3) Find a [facility] If your illness is not likely to be very severe or complicated, you may want to try a walk in clinic. These are popping up all over the country in pharmacies, drugstores, and shopping centers. They're usually staffed by nurse practitioners or physician assistants that have been trained to treat common illnesses and complaints. They're usually fairly quick and inexpensive. However, if you have serious medical issues or chronic medical problems, these are probably not your best option.  No Primary Care Doctor: - Call Health Connect at  [phone] - they can help you locate a primary care doctor that  accepts your insurance, provides certain services, etc. - Physician Referral Service- [phone]  Chronic Pain Problems: Organization         Address  Phone   Notes  [facility]  [phone] Patients need to be referred by their primary care doctor.   Medication Assistance: Organization         Address  Phone   Notes  [facility] [address] [place], [place] [address] [phone] --Must be a resident of [facility] -- Must have NO insurance coverage whatsoever (no Medicaid/ Medicare, etc.) -- The pt. MUST have a primary care doctor that directs their care regularly and follows them in the community   MedAssist  [phone]   [employer]  [phone]    Agencies that provide inexpensive medical care: Organization         Address  Phone   Notes  [place]  [phone]   [facility]    [phone]   [facility] [place], [place] [address] [phone]   [place] [address], [place] [phone]   Planned Parenthood    [phone]   [facility]    [phone]   [place] and [place] [address] [place], [employer] Phone:  [phone], Fax:  [phone] Hours of Operation:  9 am - 6 pm, M-F.  Also accepts Medicaid/Medicare and self-pay.  [facility] for [place] [address], [address], [place] Phone: [phone], Fax: [phone]. Hours of Operation:  8:30 am - 5:30 pm, M-F.  Also accepts Medicaid and self-pay.  [facility] [place] [address] [place] Phone: [phone]   [place], [place], [place] [phone], Ext. [phone] Mondays & Thursdays: 7-9 AM.  First 15 patients are seen on a first come, first serve basis.    [place] Providers:  Organization         Address  Phone   Notes  [facility] [address], [place] [phone] Also accepts self-pay patients.  [facility] [id number] [place], [place]  [phone]   [place], [address], [place] [phone]   [facility] [address], [place] [phone]   Lucianne Lei [address], [place]   [phone] Only accepts [place] Access [place] patients after they have their name applied to their card.   Self-Pay (no insurance) in [facility]:  Organization         Address  Phone   Notes  Sickle Cell Patients, [facility] Internal [facility] [place] [phone]   [facility] [place] [phone]   [facility] [place]  [place], [place], [place] [phone]   [facility]/Dr. Osei-Bonsu  [address], [place] or [address], [place] [phone] Phone number for both [place] and [place] locations is the same.  Urgent Medical and [facility] [place] [address], [place] [phone]   [facility] [address] [place] or [address] [phone]   [facility] [address] [place] [phone], phone; [phone], fax Sees patients 1st and 3rd Saturday of every month.  Must not qualify for public or private insurance (i.e. Medicaid, Medicare, [place] Health Choice, Veterans' Benefits)  Household income should be no more than 200% of the poverty level The clinic cannot treat you if you are pregnant or think you are pregnant  Sexually transmitted diseases are not treated at the clinic.    Dental Care: Organization         Address  Phone  Notes  [facility] of [facility] [place] [phone] Accepts children up to age 28 who are enrolled in [place] or [place]; pregnant women with a Medicaid card; and children who have applied for Medicaid or [place] Health Choice, but were declined, whose parents can pay a reduced fee at time of service.  [facility] Department of [facility]  [address], [place] [phone] Accepts children up to age 11 who are enrolled  in [place] or [place]; pregnant women with a Medicaid card; and children who have applied for Medicaid or [place] Health Choice, but were declined, whose parents can pay a reduced fee at time of service.  [place] Adult Dental Access PROGRAM  [place] [phone] Patients are seen by appointment only. Walk-ins  are not accepted. [place] will see patients 76 years of age and older. Monday - Tuesday (8am-5pm) Most Wednesdays (8:30-5pm) $30 per visit, cash only  [facility] Adult Dental Access PROGRAM  [address], [facility] [phone] Patients are seen by appointment only. Walk-ins are not accepted. [place] will see patients 16 years of age and older. One Wednesday Evening (Monthly: Volunteer Based).  $30 per visit, cash only  [place]  [phone] for adults; Children under age 71, call Graduate Pediatric Dentistry at [phone]. Children aged 4-14, please call [phone] to request a pediatric application.  Dental services are provided in all areas of dental care including fillings, crowns and bridges, complete and partial dentures, implants, gum treatment, root canals, and extractions. Preventive care is also provided. Treatment is provided to both adults and children. Patients are selected via a lottery and there is often a waiting list.   [facility] [address], [place]  [phone] [email or web address]   [facility] [address] [place], [place] [phone], Ext. [phone] Second and Fourth Thursday of each month, opens at 6:30 AM; Clinic ends at 9 AM.  Patients are seen on a first-come first-served basis, and a limited number are seen during each clinic.   [facility]  [address] Hillard Danker [place], [place] [phone]   Eligibility Requirements You must have lived in [place], [place], or [place] counties for at least the last three months.   You cannot be  eligible for state or federal sponsored [employer], including [employer], [place], or [employer].   You generally cannot be eligible for healthcare insurance through your employer.    How to apply: Eligibility screenings are held every Tuesday and Wednesday afternoon from 1:00 pm until 4:00 pm. You do not need an appointment for the interview!  [facility] [address] [place], [place]   [place]  [place] Department  [place]  [phone]    Behavioral Health Resources in the Community: Intensive Outpatient Programs Organization         Address  Phone  Notes  [place]. [address] [place], [place] [phone]   [facility] Outpatient [address] [place], [address] [place]   ADS: Alcohol & Drug Svcs [address], [place], [place]   [place] [address]  [place], [place] or [phone]   Substance Abuse Resources Organization         Address  Phone  Notes  Alcohol and Drug Services  [phone]   [place]  [phone]   [facility] [place]   Chinita Pester  [phone]   Residential & Outpatient Substance Abuse Program  6107718822   Psychological Services Organization         Address  Phone  Notes  [facility] [place]  [place]  [phone]   [place] [address], [place] [phone] or [phone]    Mobile Crisis Teams Organization         Address  Phone  Notes  [facility]atives, Mobile Crisis Care Unit  [phone]   [facility]ive Psychotherapeutic Services  [address] [place], [place]   [facility] [address] [place] [phone]    Self-Help/Support  Groups Organization          Address  Phone             Notes  [facility]ental [facility]oc[facility] of [place] - variety of support groups  [place] Call for more information  Narcotics Anonymous (NA), [facility] [address], [employer] [place]  2 meetings at this location   Special educational needs teacher         Address  Phone  Notes  ASAP Residential Treatment [place],    Magazine  [phone]   [facility]  [address] [place] [id number], [place], [place]   [place], [place] [phone] Admissions: 8am-3pm M-F  Incentives Substance [place] [address]    [place], [place] [phone]   [facility] [address] [place], [place], [place]   [facility] [address]  [place], [place]   [facility] - Intensive Outpatient [address] Kristeen [address], [place], [place]   [facility] ([place].) [place].,  [place], [place] [phone] or [phone]   [facility] Services (RTS) [address], [place], [place] Accepts Medicaid  [facility] [place] [address]  [place] 1-7242299750 Substance Abuse/Addiction Treatment   [facility] Organization         Address  Phone  Notes  [facility]  [phone]   Domenic Schwab, PhD [address] Ar[place], [place]   [phone] or [phone]   [place], [place] [phone]   [facility] [address] [place], [place] [phone] Insurance/Medicaid/sponsorship through [facility] and Families [address], [address] [place]                                    [place], [place] [phone] [place] [address], [place] [phone]    Dr. Adele Schilder  [phone]   [facility] Dept. 1) [address] [place] 2) [place] 3)  [place] [address], [place] [phone]  [phone]   [place] [phone] or [phone] (After Hours)

## 2014-10-26 NOTE — ED Provider Notes (Signed)
CSN: [id number]     Arrival date & time 10/26/14  1052 History   First MD Initiated Contact with Patient 10/26/14 1101     Chief Complaint  Patient presents with  . Back Pain    recurrent upper back pain  . Shoulder Pain    r/shoulder pain, recurrent     (Consider location/radiation/quality/duration/timing/severity/associated sxs/prior Treatment) HPI  Leah [name] is a 24 y.o. female complaining of upper back pain worse on the right than the left onset last week, rated at 6 out of 10 and exacerbated by movement and palpation. She's been taking 800 mg Motrin at home with good relief she's also been taking Robaxin with little relief. Patient states that she was seen for this several months ago, given prescription for narcotics which helped her sleep. States that she has cut back on her hours and does not lift as much is normal. She denies numbness, weakness, trauma, cervicalgia.    Past Medical History  Diagnosis Date  . Asthma   . Herpes genitalia   . Ovarian cyst    Past Surgical History  Procedure Laterality Date  . Eye muscle surgery     Family History  Problem Relation Age of Onset  . Asthma Father   . Hyperlipidemia Father   . Asthma Sister   . Asthma Brother   . Asthma Paternal Aunt   . Asthma Paternal Grandmother   . Cancer Mother    History  Substance Use Topics  . Smoking status: Never Smoker   . Smokeless tobacco: Not on file  . Alcohol Use: No   OB History    Gravida Para Term Preterm AB TAB SAB Ectopic Multiple Living   0              Review of Systems  10 systems reviewed and found to be negative, except as noted in the HPI.   Allergies  Penicillins  Home Medications   Prior to Admission medications   Medication Sig Start Date End Date Taking? Authorizing Provider  albuterol (PROVENTIL HFA;VENTOLIN HFA) 108 (90 BASE) MCG/ACT inhaler Inhale 2 puffs into the lungs every 6 (six) hours as needed for wheezing or shortness of breath.    Historical  Provider, MD  ibuprofen (ADVIL,MOTRIN) 800 MG tablet Take 800 mg by mouth every 8 (eight) hours as needed for moderate pain.    Historical Provider, MD  levonorgestrel (MIRENA) 20 MCG/24HR IUD 1 each by Intrauterine route once. January 2015    Historical Provider, MD  meloxicam (MOBIC) 7.5 MG tablet Take 2 tablets (15 mg total) by mouth daily. 10/02/14   Leah Breach, PA-C  methocarbamol (ROBAXIN) 500 MG tablet Take 1 tablet (500 mg total) by mouth 2 (two) times daily as needed for muscle spasms. 10/02/14   Leah Breach, PA-C  Multiple Vitamins-Minerals (HAIR VITAMINS PO) Take 2 tablets by mouth daily.    Historical Provider, MD  naproxen sodium (ANAPROX) 220 MG tablet Take 220 mg by mouth 2 (two) times daily as needed (pain).    Historical Provider, MD  ondansetron (ZOFRAN ODT) 4 MG disintegrating tablet Take 1 tablet (4 mg total) by mouth every 8 (eight) hours as needed for nausea or vomiting. Patient not taking: Reported on 10/02/2014 06/17/14   Leah Bryant, MD  traMADol (ULTRAM) 50 MG tablet Take 1 tablet (50 mg total) by mouth every 12 (twelve) hours as needed for severe pain. 10/02/14   Leah Breach, PA-C  valACYclovir (VALTREX) 500 MG tablet Take 500 mg by mouth  daily.    Historical Provider, MD   BP 111/61 mmHg  Pulse 86  Temp(Src) 98.3 F (36.8 C) (Oral)  Resp 18  SpO2 100%  LMP 09/01/2014 Physical Exam  Constitutional: She is oriented to person, place, and time. She appears well-developed and well-nourished. No distress.  HENT:  Head: Normocephalic.  Eyes: Conjunctivae and EOM are normal.  Neck: Normal range of motion. Neck supple.  No midline C-spine  tenderness to palpation or step-offs appreciated. Patient has full range of motion without pain.  Spurling test is negative bilaterally.  Cardiovascular: Normal rate.   Pulmonary/Chest: Effort normal and breath sounds normal. No stridor. No respiratory distress. She has no wheezes. She has no rales. She exhibits no tenderness.    Musculoskeletal: Normal range of motion. She exhibits tenderness.       Back:  Neurological: She is alert and oriented to person, place, and time.  Follows commands, Clear, goal oriented speech, Strength is 5 out of 5x4 extremities, patient ambulates with a coordinated in nonantalgic gait. Sensation is grossly intact.   Psychiatric: She has a normal mood and affect.  Nursing note and vitals reviewed.   ED Course  Procedures (including critical care time) Labs Review Labs Reviewed - No data to display  Imaging Review No results found.   EKG Interpretation None      MDM   Final diagnoses:  Trapezius muscle spasm    Filed Vitals:   10/26/14 1106  BP: 111/61  Pulse: 86  Temp: 98.3 F (36.8 C)  TempSrc: Oral  Resp: 18  SpO2: 100%    Medications - No data to display  Leah [name] is a pleasant 24 y.o. female presenting with right shoulder pain and trapezius spasm. Neuro exam is nonfocal, no sign of nerve impingement. I've encouraged her to continue to take her high-dose Motrin and will write her for Flexeril as ulcerative to Robaxin. Will not write narcotic pain medications.   Evaluation does not show pathology that would require ongoing emergent intervention or inpatient treatment. Pt is hemodynamically stable and mentating appropriately. Discussed findings and plan with patient/guardian, who agrees with care plan. All questions answered. Return precautions discussed and outpatient follow up given.   Discharge Medication List as of 10/26/2014 11:24 AM    START taking these medications   Details  cyclobenzaprine (FLEXERIL) 10 MG tablet Take 1 tablet (10 mg total) by mouth 2 (two) times daily as needed for muscle spasms., Starting 10/26/2014, Until Discontinued, Print             Monico Blitz, PA-C 10/26/14 1146  Orpah Greek, MD 10/27/14 [phone]

## 2014-10-26 NOTE — ED Notes (Signed)
Pt reports recurrent r/shoulder and upper back pain. Pain returned last week. She after completed treatment with medication in January

## 2015-07-21 ENCOUNTER — Emergency Department (HOSPITAL_COMMUNITY)
Admission: EM | Admit: 2015-07-21 | Discharge: 2015-07-21 | Disposition: A | Discharge status: Home / Self Care | Payer: 59 | Attending: Emergency Medicine | Admitting: Emergency Medicine

## 2015-07-21 ENCOUNTER — Encounter (HOSPITAL_COMMUNITY): Payer: Self-pay

## 2015-07-21 DIAGNOSIS — Z880 Allergy status to penicillin: Secondary | ICD-10-CM | POA: Insufficient documentation

## 2015-07-21 DIAGNOSIS — Z8742 Personal history of other diseases of the female genital tract: Secondary | ICD-10-CM | POA: Diagnosis not present

## 2015-07-21 DIAGNOSIS — H9203 Otalgia, bilateral: Secondary | ICD-10-CM | POA: Diagnosis present

## 2015-07-21 DIAGNOSIS — Z791 Long term (current) use of non-steroidal anti-inflammatories (NSAID): Secondary | ICD-10-CM | POA: Diagnosis not present

## 2015-07-21 DIAGNOSIS — H6693 Otitis media, unspecified, bilateral: Secondary | ICD-10-CM | POA: Diagnosis not present

## 2015-07-21 DIAGNOSIS — Z8619 Personal history of other infectious and parasitic diseases: Secondary | ICD-10-CM | POA: Diagnosis not present

## 2015-07-21 DIAGNOSIS — Z79899 Other long term (current) drug therapy: Secondary | ICD-10-CM | POA: Insufficient documentation

## 2015-07-21 DIAGNOSIS — J45909 Unspecified asthma, uncomplicated: Secondary | ICD-10-CM | POA: Insufficient documentation

## 2015-07-21 MED ORDER — AZITHROMYCIN 250 MG PO TABS: 250.0000 mg | ORAL_TABLET | Freq: Every day | ORAL | Status: DC

## 2015-07-21 MED ORDER — IBUPROFEN 800 MG PO TABS: 800.0000 mg | ORAL_TABLET | Freq: Three times a day (TID) | ORAL | Status: DC

## 2015-07-21 NOTE — ED Notes (Signed)
She c/o bilat. Earache x 1 week with some diminution of hearing.  She is otherwise in no distress and is healthy-looking.

## 2015-07-21 NOTE — Discharge Instructions (Signed)
1. Medications: azithromycin, usual home medications 2. Treatment: rest, drink plenty of fluids 3. Follow Up: please followup with your primary doctor this week for discussion of your diagnoses and further evaluation after today's visit; if you do not have a primary care doctor use the resource guide provided to find one; please return to the ER for high fever, severe pain, new or worsening symptoms   Otitis Media, Adult Otitis media is redness, soreness, and puffiness (swelling) in the space just behind your eardrum (middle ear). It may be caused by allergies or infection. It often happens along with a cold. HOME CARE  Take your medicine as told. Finish it even if you start to feel better.  Only take over-the-counter or prescription medicines for pain, discomfort, or fever as told by your doctor.  Follow up with your doctor as told. GET HELP IF:  You have otitis media only in one ear, or bleeding from your nose, or both.  You notice a lump on your neck.  You are not getting better in 3-5 days.  You feel worse instead of better. GET HELP RIGHT AWAY IF:   You have pain that is not helped with medicine.  You have puffiness, redness, or pain around your ear.  You get a stiff neck.  You cannot move part of your face (paralysis).  You notice that the bone behind your ear hurts when you touch it. MAKE SURE YOU:   Understand these instructions.  Will watch your condition.  Will get help right away if you are not doing well or get worse.   This information is not intended to replace advice given to you by your health care provider. Make sure you discuss any questions you have with your health care provider.   Document Released: 02/18/2008 Document Revised: 09/22/2014 Document Reviewed: 03/29/2013 Elsevier Interactive Patient Education 2016 [employer].   Emergency Department Resource Guide 1) Find a Doctor and Pay Out of Pocket Although you won't have to find out who is  covered by your insurance plan, it is a good idea to ask around and get recommendations. You will then need to call the office and see if the doctor you have chosen will accept you as a new patient and what types of options they offer for patients who are self-pay. Some doctors offer discounts or will set up payment plans for their patients who do not have insurance, but you will need to ask so you aren't surprised when you get to your appointment.  2) Contact Your Local Health Department Not all health departments have doctors that can see patients for sick visits, but many do, so it is worth a call to see if yours does. If you don't know where your local health department is, you can check in your phone book. The CDC also has a tool to help you locate your state's health department, and many state websites also have listings of all of their local health departments.  3) Find a [facility] If your illness is not likely to be very severe or complicated, you may want to try a walk in clinic. These are popping up all over the country in pharmacies, drugstores, and shopping centers. They're usually staffed by nurse practitioners or physician assistants that have been trained to treat common illnesses and complaints. They're usually fairly quick and inexpensive. However, if you have serious medical issues or chronic medical problems, these are probably not your best option.  No Primary Care Doctor: - Call  Health Connect at  [phone] - they can help you locate a primary care doctor that  accepts your insurance, provides certain services, etc. - Physician Referral Service- [phone]  Chronic Pain Problems: Organization         Address  Phone   Notes  [facility]  [phone] Patients need to be referred by their primary care doctor.   Medication Assistance: Organization         Address  Phone   Notes  [facility] Medication [facility] [address] [place], [place] [address] [phone] --Must be a resident of [facility] -- Must have NO insurance coverage whatsoever (no Medicaid/ Medicare, etc.) -- The pt. MUST have a primary care doctor that directs their care regularly and follows them in the community   MedAssist  [phone]   [employer]  [phone]    Agencies that provide inexpensive medical care: Organization         Address  Phone   Notes  [place]  [phone]   [facility]    [phone]   [facility] [place], [place] [address] [phone]   [place] [address] [place] [phone]   Planned Parenthood    [phone]   [facility]    [phone]   [place] and [place] [address], [place] Phone:  [phone], Fax:  [phone] Hours of Operation:  9 am - 6 pm, M-F.  Also accepts Medicaid/Medicare and self-pay.  [facility] for [place], [address], [place] Phone: [phone], Fax: [phone]. Hours of Operation:  8:30 am - 5:30 pm, M-F.  Also accepts Medicaid and self-pay.  [facility] [place] [address] [place] Phone: [phone]   [place], [place], [place] [phone], Ext. [phone] Mondays & Thursdays: 7-9 AM.  First 15 patients are seen on a first come, first serve basis.    [place]bert [place] Providers:  Organization         Address  Phone   Notes  [facility] [address], [place] [phone] Also accepts self-pay patients.  [facility] [address], [place]  [phone]   [place], [address], [place] [phone]   [facility] [address], [place] [phone]   Lucianne Lei [address], [place]   [phone] Only accepts [place] Access [place] patients after they have their name applied to their card.   Self-Pay (no insurance) in [facility]:  Organization         Address  Phone   Notes  Sickle Cell Patients, [facility] [place] [phone]   [facility] [place] [phone]   [facility] [place]  [place], [place], [place] [phone]   [facility]/Dr. Osei-Bonsu  [address], [place] or [address], [place] [phone] Phone number for both [place] and [place] locations is the same.  [facility] [address], [name] [phone]   [place]  [address] [place] or [address] [phone]   [facility] [place] [phone], phone; [phone], fax Sees patients 1st and 3rd Saturday of every month.  Must not qualify for public or private insurance (i.e. Medicaid, Medicare, [place] Health Choice, Veterans' Benefits)  Household income should be no more than 200% of the poverty level The clinic cannot treat you if you are pregnant or think you are pregnant  Sexually transmitted diseases are not treated at the clinic.    Dental Care: Organization         Address  Phone  Notes  [facility] [place] [phone] Accepts children up to age 73 who are enrolled in [place] or [place]; pregnant women with a Medicaid card; and children who have applied for Medicaid or [place] Health Choice, but were declined, whose parents can pay a reduced fee at time of service.  [facility]  [address], [place] [phone] Accepts children up to age 59 who are enrolled  in [place] or [place]; pregnant women with a Medicaid card; and children who have applied for Medicaid or [place] Health Choice, but were declined, whose parents can pay a reduced fee at time of service.  [place] Adult Dental Access PROGRAM  [place] [phone] Patients are seen by appointment only. Walk-ins are not accepted. [place] will see patients 26 years of age and older. Monday - Tuesday (8am-5pm) Most Wednesdays (8:30-5pm) $30 per visit, cash only  [facility] Adult Dental Access PROGRAM  [address], [facility] [phone] Patients are seen by appointment only. Walk-ins are not accepted. [place] will see patients 29 years of age and older. One Wednesday Evening (Monthly: Volunteer Based).  $30 per visit, cash only  [place]  [phone] for adults; Children under age 66, call Graduate Pediatric Dentistry at [phone]. Children aged 45-14, please call [phone] to request a pediatric application.  Dental services are provided in all areas of dental care including fillings, crowns and bridges, complete and partial dentures, implants, gum treatment, root canals, and extractions. Preventive care is also provided. Treatment is provided to both adults and children. Patients are selected via a lottery and there is often a waiting list.   [facility] [address] [place]  [phone] [email or web address]   [facility] [address] [place], [place] [phone], Ext. [phone] Second and Fourth Thursday of each month, opens at 6:30 AM; Clinic ends at 9 AM.  Patients are seen on a first-come first-served basis, and a limited number are seen during each clinic.   [facility]  [address] Hillard Danker [place], [place] [phone]   Eligibility Requirements You must have lived in [place], [place], or [place] counties for at least the last three months.   You cannot be  eligible for state or federal sponsored [employer], including [employer], [place], or [employer].   You generally cannot be eligible for healthcare insurance through your employer.    How to apply: Eligibility screenings are held every Tuesday and Wednesday afternoon from 1:00 pm until 4:00 pm. You do not need an appointment for the interview!  [facility] [address] [place], [place]   [place]  [place] Department  [place]  [phone]    Behavioral Health Resources in the Community: Intensive Outpatient Programs Organization         Address  Phone  Notes  [place]. [address] [place] [address], [place], [place] [phone]   [facility] Outpatient [address], [place], [place]   ADS: [facility] Drug [facility] [address], [place], [place]   [place] [address]  [place], [place] or [phone]   Substance Abuse Resources Organization         Address  Phone  Notes  Alcohol and Drug Services  [phone]   [place]  [phone]   [facility] [place]   Chinita Pester  [phone]   Residential & Outpatient Substance Abuse Program  [phone]   Psychological Services Organization         Address  Phone  Notes  [facility] [place]  [place]  [phone]   [place] [address], [place] or [phone]    Mobile Crisis Teams Organization         Address  Phone  Notes  [facility], Mobile Crisis Care Unit  [phone]   [facility]  [address] [place], [place]   [place] [address], [place] [phone]    Self-Help/Support Groups Organization          Address  Phone             Notes  [place]. of [place] - variety of support groups  [place] Call for more information  Narcotics Anonymous (NA), [facility] [address], [employer] [place]  2 meetings at this location   Special educational needs teacher         Address  Phone  Notes  [facility]idential [facility] [place],    [place]  [phone]   [facility]  [address], [place] [address], [place], [place]   [place], [place] [phone] Admissions: 8am-3pm M-F  Incentives Substance [place] [address],    [place], [place] [phone]   [facility] [address] [place], [place], [place]   [facility] [address],  [place], [place]   [facility] - Intensive Outpatient [address]een [address], [place], [place]   [facility] ([place] [place].,  [place], [place] [phone] or [phone]   [facility] (RTS) [address], [place], [place] Accepts Medicaid  [facility] [place] [address],  [place] [phone] Substance Abuse/Addiction Treatment   [facility] Organization         Address  Phone  Notes  [facility]  [phone]   Domenic Schwab, PhD [address] Arlis Porta [place], [place]   [phone] or [phone]   [place], [place] [phone]   [place] [address], [place], [place] [phone] Insurance/Medicaid/sponsorship through [employer] and Families [address]  [place], [place] [phone] [place], [place] [phone]    Dr. Adele Schilder  [phone]   [facility] Dept. 1) [address] [place] 2) [place] 3)  [place] [address], [place] [phone]  [phone]   [place] [phone] or [phone] (After Hours)

## 2015-07-21 NOTE — ED Provider Notes (Signed)
History  By signing my name below, I, Marlowe Kays, attest that this documentation has been prepared under the direction and in the presence of Bernerd Limbo, [place]. Electronically Signed: Marlowe Kays, ED Scribe. 07/21/2015. 1:04 PM.  Chief Complaint  Patient presents with  . Otalgia    The history is provided by the patient and medical records. No language interpreter was used.   HPI Comments:  Leah [name] is a 24 year old female with a PMH significant for asthma who presents to the ED with bilateral otalgia, right greater than left, which began approximately two weeks ago. She states her friend is currently being treated for an ear infection. She reports lying down exacerbates her pain. She states she has been using over the counter ear drops for symptom relief, which have not been effective. She denies cough, congestion, dizziness, light-headedness, fever, chills, HA. She denies swimming recently. She states she has had swimmer's ear in the past but that her symptoms feel different. Patient reports allergy to PCN with a reaction of hives and swelling. She states she does not have a PCP.  Past Medical History  Diagnosis Date  . Asthma   . Herpes genitalia   . Ovarian cyst    Past Surgical History  Procedure Laterality Date  . Eye muscle surgery     Family History  Problem Relation Age of Onset  . Asthma Father   . Hyperlipidemia Father   . Asthma Sister   . Asthma Brother   . Asthma Paternal Aunt   . Asthma Paternal Grandmother   . Cancer Mother    Social History  Substance Use Topics  . Smoking status: Never Smoker   . Smokeless tobacco: None  . Alcohol Use: No   OB History    Gravida Para Term Preterm AB TAB SAB Ectopic Multiple Living   0               Review of Systems  Constitutional: Negative for fever and chills.  HENT: Positive for ear pain. Negative for congestion.   Respiratory: Negative for cough and shortness of breath.    Cardiovascular: Negative for chest pain.  Gastrointestinal: Negative for nausea, vomiting and abdominal pain.  Neurological: Negative for dizziness, light-headedness and headaches.    Allergies  Penicillins  Home Medications   Prior to Admission medications   Medication Sig Start Date End Date Taking? Authorizing Provider  albuterol (PROVENTIL HFA;VENTOLIN HFA) 108 (90 BASE) MCG/ACT inhaler Inhale 2 puffs into the lungs every 6 (six) hours as needed for wheezing or shortness of breath.    Historical Provider, MD  azithromycin (ZITHROMAX) 250 MG tablet Take 1 tablet (250 mg total) by mouth daily. 500 mg (2 tablets) on day 1 followed by 250 mg (1 tablet) daily on days 2 to 5 07/21/15   Marella Chimes, PA-C  cyclobenzaprine (FLEXERIL) 10 MG tablet Take 1 tablet (10 mg total) by mouth 2 (two) times daily as needed for muscle spasms. 10/26/14   Nicole Pisciotta, PA-C  ibuprofen (ADVIL,MOTRIN) 800 MG tablet Take 1 tablet (800 mg total) by mouth 3 (three) times daily. 07/21/15   Marella Chimes, PA-C  levonorgestrel (MIRENA) 20 MCG/24HR IUD 1 each by Intrauterine route once. January 2015    Historical Provider, MD  meloxicam (MOBIC) 7.5 MG tablet Take 2 tablets (15 mg total) by mouth daily. 10/02/14   Antonietta Breach, PA-C  methocarbamol (ROBAXIN) 500 MG tablet Take 1 tablet (500 mg total) by mouth 2 (two) times daily  as needed for muscle spasms. 10/02/14   Antonietta Breach, PA-C  Multiple Vitamins-Minerals (HAIR VITAMINS PO) Take 2 tablets by mouth daily.    Historical Provider, MD  naproxen sodium (ANAPROX) 220 MG tablet Take 220 mg by mouth 2 (two) times daily as needed (pain).    Historical Provider, MD  traMADol (ULTRAM) 50 MG tablet Take 1 tablet (50 mg total) by mouth every 12 (twelve) hours as needed for severe pain. 10/02/14   Antonietta Breach, PA-C  valACYclovir (VALTREX) 500 MG tablet Take 500 mg by mouth daily.    Historical Provider, MD    Triage Vitals: BP 113/66 mmHg  Pulse 72  Temp(Src)  98.2 F (36.8 C) (Oral)  Resp 14  SpO2 100%  LMP 07/13/2015 (Exact Date) Physical Exam  Constitutional: She is oriented to person, place, and time. She appears well-developed and well-nourished. No distress.  HENT:  Head: Normocephalic and atraumatic.  Right Ear: Hearing normal. There is tenderness. Tympanic membrane is erythematous. Tympanic membrane is not perforated, not retracted and not bulging.  Left Ear: Hearing normal. There is tenderness. Tympanic membrane is erythematous. Tympanic membrane is not perforated, not retracted and not bulging.  Nose: Nose normal.  Mouth/Throat: Oropharynx is clear and moist. No oropharyngeal exudate, posterior oropharyngeal edema or posterior oropharyngeal erythema.  TTP of external ear bilaterally. No significant TTP of mastoid. Erythema to TM and canal bilaterally. No discharge.   Eyes: Conjunctivae and EOM are normal. Pupils are equal, round, and reactive to light. Right eye exhibits no discharge. Left eye exhibits no discharge. No scleral icterus.  Neck: Normal range of motion. Neck supple.  Cardiovascular: Normal rate and regular rhythm.   Pulmonary/Chest: Effort normal and breath sounds normal. No respiratory distress.  Musculoskeletal: Normal range of motion. She exhibits no edema or tenderness.  Neurological: She is alert and oriented to person, place, and time.  Skin: Skin is warm and dry. She is not diaphoretic.  Psychiatric: She has a normal mood and affect. Her behavior is normal.  Nursing note and vitals reviewed.   ED Course  Procedures (including critical care time)  DIAGNOSTIC STUDIES: Oxygen Saturation is 100% on RA, normal by my interpretation.   COORDINATION OF CARE: 12:44 PM- Will order dose of ibuprofen prior to discharge. Will send home with prescriptions for ibuprofen and antibiotics. Return precautions discussed. Will give resource guide to establish care with a PCP. Patient verbalizes understanding and agrees to  plan.  Medications - No data to display    MDM   Final diagnoses:  Bilateral acute otitis media, recurrence not specified, unspecified otitis media type    24 year old female presents with bilateral ear pain x 2 weeks. Denies cough, congestion, dizziness, light-headedness, fever, chills, HA.   Patient is afebrile. Vital signs stable. Posterior oropharynx without edema, erythema, or exudate. No nasal congestion. TTP of external ear bilaterally. No significant mastoid tenderness. Erythema to TM and canals bilaterally. No discharge.   Will treat for otitis media with azithromycin given PCN allergy and prescribe ibuprofen for pain. Patient to follow-up with PCP. Resource list given. Return precautions discussed. Patient verbalizes her understanding and is in agreement with plan.  BP 113/66 mmHg  Pulse 68  Temp(Src) 98.2 F (36.8 C) (Oral)  Resp 14  SpO2 100%  LMP 07/13/2015 (Exact Date)  I personally performed the services described in this documentation, which was scribed in my presence. The recorded information has been reviewed and is accurate.     Marella Chimes, PA-C  07/21/15 [place], MD 07/22/15 1642

## 2015-10-18 ENCOUNTER — Encounter (HOSPITAL_COMMUNITY): Payer: Self-pay | Admitting: *Deleted

## 2015-10-18 ENCOUNTER — Inpatient Hospital Stay (HOSPITAL_COMMUNITY)
Admission: AD | Admit: 2015-10-18 | Discharge: 2015-10-18 | Disposition: A | Discharge status: Home / Self Care | Payer: Self-pay | Source: Ambulatory Visit | Attending: Obstetrics & Gynecology | Admitting: Obstetrics & Gynecology

## 2015-10-18 DIAGNOSIS — N939 Abnormal uterine and vaginal bleeding, unspecified: Secondary | ICD-10-CM | POA: Insufficient documentation

## 2015-10-18 DIAGNOSIS — Z880 Allergy status to penicillin: Secondary | ICD-10-CM | POA: Insufficient documentation

## 2015-10-18 DIAGNOSIS — R1030 Lower abdominal pain, unspecified: Secondary | ICD-10-CM | POA: Insufficient documentation

## 2015-10-18 DIAGNOSIS — Z3202 Encounter for pregnancy test, result negative: Secondary | ICD-10-CM | POA: Insufficient documentation

## 2015-10-18 LAB — URINALYSIS, ROUTINE W REFLEX MICROSCOPIC
Bilirubin Urine: NEGATIVE
Glucose, UA: NEGATIVE mg/dL
Ketones, ur: NEGATIVE mg/dL
Leukocytes, UA: NEGATIVE
Nitrite: NEGATIVE
PROTEIN: NEGATIVE mg/dL
Specific Gravity, Urine: 1.015 (ref 1.005–1.030)
pH: 7 (ref 5.0–8.0)

## 2015-10-18 LAB — CBC
HCT: 39.2 % (ref 36.0–46.0)
HEMOGLOBIN: 12.9 g/dL (ref 12.0–15.0)
MCH: 28.4 pg (ref 26.0–34.0)
MCHC: 32.9 g/dL (ref 30.0–36.0)
MCV: 86.2 fL (ref 78.0–100.0)
Platelets: 322 10*3/uL (ref 150–400)
RBC: 4.55 MIL/uL (ref 3.87–5.11)
RDW: 12.5 % (ref 11.5–15.5)
WBC: 6.3 10*3/uL (ref 4.0–10.5)

## 2015-10-18 LAB — WET PREP, GENITAL
Clue Cells Wet Prep HPF POC: NONE SEEN
Sperm: NONE SEEN
TRICH WET PREP: NONE SEEN
YEAST WET PREP: NONE SEEN

## 2015-10-18 LAB — POCT PREGNANCY, URINE: Preg Test, Ur: NEGATIVE

## 2015-10-18 LAB — URINE MICROSCOPIC-ADD ON: WBC, UA: NONE SEEN WBC/hpf (ref 0–5)

## 2015-10-18 MED ORDER — KETOROLAC TROMETHAMINE 10 MG PO TABS: 10.0000 mg | ORAL_TABLET | Freq: Three times a day (TID) | ORAL | Status: DC

## 2015-10-18 MED ORDER — KETOROLAC TROMETHAMINE 60 MG/2ML IM SOLN
60.0000 mg | Freq: Once | INTRAMUSCULAR | Status: AC
  Administered 2015-10-18: 60 mg via INTRAMUSCULAR
  Filled 2015-10-18: qty 2

## 2015-10-18 NOTE — MAU Provider Note (Signed)
History     CSN: [id number]  Arrival date and time: 10/18/15 [id number]   First Provider Initiated Contact with Patient 10/18/15 [phone]      Chief Complaint  Patient presents with  . Abdominal Pain   HPI   Ms.Leah [name] is a 25 y.o. female G0P0 with a history of dermoid cysts (2 years ago) and abnormal menstrual cycles presents to MAU with abdominal pain. The pain started on Sunday and has worsened over the last few days. The pain is sharp and throbbing across the entire bottom of her stomach.  She took ibuprofen last night; 800 mg which did not help the pain. She was worried that she may have another cyst on her ovary.  She had the dermoid cyst surgically removed by her OB in [place].   She is using the nuvaring for birth control and is due to put it in today. She has not had a period since March of last year which is not uncommon for her.   She had a pelvic US done at her primary care Dr. Gabriel Carina in October which showed a couple of small uterine fibroids. Negative for ovarian cysts.   OB History    Gravida Para Term Preterm AB TAB SAB Ectopic Multiple Living   0               Past Medical History  Diagnosis Date  . Asthma   . Herpes genitalia   . Ovarian cyst     Past Surgical History  Procedure Laterality Date  . Eye muscle surgery    . Ovarian cyst removal      Family History  Problem Relation Age of Onset  . Asthma Father   . Hyperlipidemia Father   . Asthma Sister   . Asthma Brother   . Asthma Paternal Aunt   . Asthma Paternal Grandmother   . Cancer Mother     Social History  Substance Use Topics  . Smoking status: Never Smoker   . Smokeless tobacco: None  . Alcohol Use: No    Allergies:  Allergies  Allergen Reactions  . Penicillins Other (See Comments)    Family allergy     Prescriptions prior to admission  Medication Sig Dispense Refill Last Dose  . albuterol (PROVENTIL HFA;VENTOLIN HFA) 108 (90 BASE) MCG/ACT inhaler Inhale 2 puffs into the lungs  every 6 (six) hours as needed for wheezing or shortness of breath.   unknown  . azithromycin (ZITHROMAX) 250 MG tablet Take 1 tablet (250 mg total) by mouth daily. 500 mg (2 tablets) on day 1 followed by 250 mg (1 tablet) daily on days 2 to 5 6 tablet 0   . cyclobenzaprine (FLEXERIL) 10 MG tablet Take 1 tablet (10 mg total) by mouth 2 (two) times daily as needed for muscle spasms. 20 tablet 0   . ibuprofen (ADVIL,MOTRIN) 800 MG tablet Take 1 tablet (800 mg total) by mouth 3 (three) times daily. 21 tablet 0   . levonorgestrel (MIRENA) 20 MCG/24HR IUD 1 each by Intrauterine route once. January 2015   10/02/2014 at Unknown time  . meloxicam (MOBIC) 7.5 MG tablet Take 2 tablets (15 mg total) by mouth daily. 30 tablet 0   . methocarbamol (ROBAXIN) 500 MG tablet Take 1 tablet (500 mg total) by mouth 2 (two) times daily as needed for muscle spasms. 20 tablet 0   . Multiple Vitamins-Minerals (HAIR VITAMINS PO) Take 2 tablets by mouth daily.   10/01/2014 at Unknown time  . naproxen  sodium (ANAPROX) 220 MG tablet Take 220 mg by mouth 2 (two) times daily as needed (pain).   10/01/2014 at Unknown time  . traMADol (ULTRAM) 50 MG tablet Take 1 tablet (50 mg total) by mouth every 12 (twelve) hours as needed for severe pain. 15 tablet 0   . valACYclovir (VALTREX) 500 MG tablet Take 500 mg by mouth daily.   10/02/2014 at Unknown time   Results for orders placed or performed during the hospital encounter of 10/18/15 (from the past 48 hour(s))  Pregnancy, urine POC     Status: None   Collection Time: 10/18/15  9:25 AM  Result Value Ref Range   Preg Test, Ur NEGATIVE NEGATIVE    Comment:        THE SENSITIVITY OF THIS METHODOLOGY IS >24 mIU/mL   Urinalysis, Routine w reflex microscopic (not at [facility])     Status: Abnormal   Collection Time: 10/18/15  9:27 AM  Result Value Ref Range   Color, Urine YELLOW YELLOW   APPearance CLEAR CLEAR   Specific Gravity, Urine 1.015 1.005 - 1.030   pH 7.0 5.0 - 8.0   Glucose, UA  NEGATIVE NEGATIVE mg/dL   Hgb urine dipstick LARGE (A) NEGATIVE   Bilirubin Urine NEGATIVE NEGATIVE   Ketones, ur NEGATIVE NEGATIVE mg/dL   Protein, ur NEGATIVE NEGATIVE mg/dL   Nitrite NEGATIVE NEGATIVE   Leukocytes, UA NEGATIVE NEGATIVE  Urine microscopic-add on     Status: Abnormal   Collection Time: 10/18/15  9:27 AM  Result Value Ref Range   Squamous Epithelial / LPF 6-30 (A) NONE SEEN   WBC, UA NONE SEEN 0 - 5 WBC/hpf   RBC / HPF 6-30 0 - 5 RBC/hpf   Bacteria, UA FEW (A) NONE SEEN  CBC     Status: None   Collection Time: 10/18/15 10:15 AM  Result Value Ref Range   WBC 6.3 4.0 - 10.5 K/uL   RBC 4.55 3.87 - 5.11 MIL/uL   Hemoglobin 12.9 12.0 - 15.0 g/dL   HCT 39.2 36.0 - 46.0 %   MCV 86.2 78.0 - 100.0 fL   MCH 28.4 26.0 - 34.0 pg   MCHC 32.9 30.0 - 36.0 g/dL   RDW 12.5 11.5 - 15.5 %   Platelets 322 150 - 400 K/uL  Wet prep, genital     Status: Abnormal   Collection Time: 10/18/15 10:30 AM  Result Value Ref Range   Yeast Wet Prep HPF POC NONE SEEN NONE SEEN   Trich, Wet Prep NONE SEEN NONE SEEN   Clue Cells Wet Prep HPF POC NONE SEEN NONE SEEN   WBC, Wet Prep HPF POC FEW (A) NONE SEEN    Comment: MODERATE BACTERIA SEEN   Sperm NONE SEEN     Review of Systems  Constitutional: Negative for fever and chills.  Gastrointestinal: Positive for nausea, vomiting and abdominal pain. Negative for diarrhea and constipation.  Genitourinary: Negative for dysuria.   Physical Exam   Blood pressure 105/64, pulse 71, temperature 98.2 F (36.8 C), resp. rate 17, last menstrual period 11/15/2014.  Physical Exam  Constitutional: She is oriented to person, place, and time. She appears well-developed and well-nourished. No distress.  Respiratory: Effort normal.  GI: Soft. She exhibits no distension and no mass. There is no tenderness. There is no rebound and no guarding.  Genitourinary:  Speculum exam: Vagina - Small amount of dark red blood in the vault  Cervix - + active bleeding   Bimanual exam: Cervix closed,  no CMT  Uterus non tender, normal size Adnexa non tender, no masses bilaterally GC/Chlam, wet prep done Chaperone present for exam.  Musculoskeletal: Normal range of motion.  Neurological: She is alert and oriented to person, place, and time.  Skin: Skin is warm. She is not diaphoretic.  Psychiatric: Her behavior is normal.    MAU Course  Procedures  None  MDM  Toradol 60 mg IM given in MAU.  Wet prep  GC  Patient states significant improvement following Toradol. She currently rates her pain 2/10 and is resting/sleeping in the bed.   Assessment and Plan   A:  1. Lower abdominal pain   2. Abnormal vaginal bleeding     P:  Discharge home in stable condition Patient would like to follow up with her GYN Dr. In [place] given if the patient decides she wants to follow up there. Patient is here in [place] for school RX: Toradol X 3 days only Return to MAU if symptoms worsen Bleeding precautions.    Lezlie Lye, NP 10/18/2015 10:39 AM

## 2015-10-18 NOTE — Discharge Instructions (Signed)

## 2015-10-18 NOTE — MAU Note (Addendum)
Pt presents to MAU with complaints of abnormal vaginal bleeding. PT  Has not had a cycle since march and she started bleeding yesterday. Last intercourse in June. Novaring for birth control. Lower abdominal pain

## 2015-10-19 LAB — GC/CHLAMYDIA PROBE AMP (~~LOC~~) NOT AT ARMC
Chlamydia: NEGATIVE
NEISSERIA GONORRHEA: NEGATIVE

## 2015-11-07 ENCOUNTER — Emergency Department (HOSPITAL_COMMUNITY)
Admission: EM | Admit: 2015-11-07 | Discharge: 2015-11-07 | Disposition: A | Discharge status: Home / Self Care | Payer: 59 | Attending: Emergency Medicine | Admitting: Emergency Medicine

## 2015-11-07 ENCOUNTER — Emergency Department (HOSPITAL_COMMUNITY): Payer: 59

## 2015-11-07 ENCOUNTER — Encounter (HOSPITAL_COMMUNITY): Payer: Self-pay | Admitting: Emergency Medicine

## 2015-11-07 DIAGNOSIS — Z8742 Personal history of other diseases of the female genital tract: Secondary | ICD-10-CM | POA: Insufficient documentation

## 2015-11-07 DIAGNOSIS — R000 Tachycardia, unspecified: Secondary | ICD-10-CM | POA: Insufficient documentation

## 2015-11-07 DIAGNOSIS — J45901 Unspecified asthma with (acute) exacerbation: Secondary | ICD-10-CM | POA: Diagnosis not present

## 2015-11-07 DIAGNOSIS — Z79899 Other long term (current) drug therapy: Secondary | ICD-10-CM | POA: Insufficient documentation

## 2015-11-07 DIAGNOSIS — R509 Fever, unspecified: Secondary | ICD-10-CM | POA: Diagnosis not present

## 2015-11-07 DIAGNOSIS — R109 Unspecified abdominal pain: Secondary | ICD-10-CM | POA: Insufficient documentation

## 2015-11-07 DIAGNOSIS — Z3202 Encounter for pregnancy test, result negative: Secondary | ICD-10-CM | POA: Insufficient documentation

## 2015-11-07 DIAGNOSIS — Z880 Allergy status to penicillin: Secondary | ICD-10-CM | POA: Insufficient documentation

## 2015-11-07 DIAGNOSIS — Z8619 Personal history of other infectious and parasitic diseases: Secondary | ICD-10-CM | POA: Insufficient documentation

## 2015-11-07 DIAGNOSIS — R110 Nausea: Secondary | ICD-10-CM | POA: Diagnosis not present

## 2015-11-07 DIAGNOSIS — R0602 Shortness of breath: Secondary | ICD-10-CM | POA: Diagnosis present

## 2015-11-07 LAB — COMPREHENSIVE METABOLIC PANEL
ALT: 11 U/L — ABNORMAL LOW (ref 14–54)
AST: 19 U/L (ref 15–41)
Albumin: 3.9 g/dL (ref 3.5–5.0)
Alkaline Phosphatase: 48 U/L (ref 38–126)
Anion gap: 10 (ref 5–15)
BUN: 12 mg/dL (ref 6–20)
CO2: 25 mmol/L (ref 22–32)
Calcium: 8.7 mg/dL — ABNORMAL LOW (ref 8.9–10.3)
Chloride: 106 mmol/L (ref 101–111)
Creatinine, Ser: 0.77 mg/dL (ref 0.44–1.00)
GFR calc Af Amer: >60 mL/min (ref >60)
GFR calc non Af Amer: >60 mL/min (ref >60)
Glucose, Bld: 94 mg/dL (ref 65–99)
Potassium: 4.1 mmol/L (ref 3.5–5.1)
Sodium: 141 mmol/L (ref 135–145)
Total Bilirubin: 0.3 mg/dL (ref 0.3–1.2)
Total Protein: 7.6 g/dL (ref 6.5–8.1)

## 2015-11-07 LAB — URINALYSIS, ROUTINE W REFLEX MICROSCOPIC
BILIRUBIN URINE: NEGATIVE
Glucose, UA: NEGATIVE mg/dL
Ketones, ur: NEGATIVE mg/dL
Leukocytes, UA: NEGATIVE
Nitrite: NEGATIVE
PROTEIN: 30 mg/dL — AB
Specific Gravity, Urine: 1.043 — ABNORMAL HIGH (ref 1.005–1.030)
pH: 6 (ref 5.0–8.0)

## 2015-11-07 LAB — CBC WITH DIFFERENTIAL/PLATELET
Basophils Absolute: 0 10*3/uL (ref 0.0–0.1)
Basophils Relative: 0 %
Eosinophils Absolute: 0 10*3/uL (ref 0.0–0.7)
Eosinophils Relative: 0 %
HCT: 42.1 % (ref 36.0–46.0)
Hemoglobin: 13.6 g/dL (ref 12.0–15.0)
Lymphocytes Relative: 15 %
Lymphs Abs: 1 10*3/uL (ref 0.7–4.0)
MCH: 28.5 pg (ref 26.0–34.0)
MCHC: 32.3 g/dL (ref 30.0–36.0)
MCV: 88.1 fL (ref 78.0–100.0)
Monocytes Absolute: 0.6 10*3/uL (ref 0.1–1.0)
Monocytes Relative: 8 %
Neutro Abs: 5.3 10*3/uL (ref 1.7–7.7)
Neutrophils Relative %: 77 %
Platelets: 334 10*3/uL (ref 150–400)
RBC: 4.78 MIL/uL (ref 3.87–5.11)
RDW: 12.4 % (ref 11.5–15.5)
WBC: 6.9 10*3/uL (ref 4.0–10.5)

## 2015-11-07 LAB — URINE MICROSCOPIC-ADD ON

## 2015-11-07 LAB — LIPASE, BLOOD: LIPASE: 23 U/L (ref 11–51)

## 2015-11-07 LAB — POC URINE PREG, ED: PREG TEST UR: NEGATIVE

## 2015-11-07 MED ORDER — ALBUTEROL SULFATE (2.5 MG/3ML) 0.083% IN NEBU
5.0000 mg | INHALATION_SOLUTION | Freq: Once | RESPIRATORY_TRACT | Status: AC
  Administered 2015-11-07: 5 mg via RESPIRATORY_TRACT
  Filled 2015-11-07: qty 6

## 2015-11-07 MED ORDER — ALBUTEROL SULFATE HFA 108 (90 BASE) MCG/ACT IN AERS
2.0000 | INHALATION_SPRAY | Freq: Once | RESPIRATORY_TRACT | Status: AC
  Administered 2015-11-07: 2 via RESPIRATORY_TRACT
  Filled 2015-11-07: qty 6.7

## 2015-11-07 MED ORDER — IPRATROPIUM BROMIDE 0.02 % IN SOLN
0.5000 mg | Freq: Once | RESPIRATORY_TRACT | Status: AC
  Administered 2015-11-07: 0.5 mg via RESPIRATORY_TRACT
  Filled 2015-11-07: qty 2.5

## 2015-11-07 NOTE — Discharge Instructions (Signed)
1. Medications: albuterol inhaler, usual home medications 2. Treatment: rest, drink plenty of fluids 3. Follow Up: please followup with your primary doctor for discussion of your diagnoses and further evaluation after today's visit and for repeat EKG; if you do not have a primary care doctor use the resource guide provided to find one; please return to the ER for severe chest pain, increased shortness of breath, new or worsening symptoms   Asthma, Adult Asthma is a condition of the lungs in which the airways tighten and narrow. Asthma can make it hard to breathe. Asthma cannot be cured, but medicine and lifestyle changes can help control it. Asthma may be started (triggered) by:  Animal skin flakes (dander).  Dust.  Cockroaches.  Pollen.  Mold.  Smoke.  Cleaning products.  Hair sprays or aerosol sprays.  Paint fumes or strong smells.  Cold air, weather changes, and winds.  Crying or laughing hard.  Stress.  Certain medicines or drugs.  Foods, such as dried fruit, potato chips, and sparkling grape juice.  Infections or conditions (colds, flu).  Exercise.  Certain medical conditions or diseases.  Exercise or tiring activities. HOME CARE   Take medicine as told by your doctor.  Use a peak flow meter as told by your doctor. A peak flow meter is a tool that measures how well the lungs are working.  Record and keep track of the peak flow meter's readings.  Understand and use the asthma action plan. An asthma action plan is a written plan for taking care of your asthma and treating your attacks.  To help prevent asthma attacks:  Do not smoke. Stay away from secondhand smoke.  Change your heating and air conditioning filter often.  Limit your use of fireplaces and wood stoves.  Get rid of pests (such as roaches and mice) and their droppings.  Throw away plants if you see mold on them.  Clean your floors. Dust regularly. Use cleaning products that do not  smell.  Have someone vacuum when you are not home. Use a vacuum cleaner with a HEPA filter if possible.  Replace carpet with wood, tile, or vinyl flooring. Carpet can trap animal skin flakes and dust.  Use allergy-proof pillows, mattress covers, and box spring covers.  Wash bed sheets and blankets every week in hot water and dry them in a dryer.  Use blankets that are made of polyester or cotton.  Clean bathrooms and kitchens with bleach. If possible, have someone repaint the walls in these rooms with mold-resistant paint. Keep out of the rooms that are being cleaned and painted.  Wash hands often. GET HELP IF:  You have make a whistling sound when breaking (wheeze), have shortness of breath, or have a cough even if taking medicine to prevent attacks.  The colored mucus you cough up (sputum) is thicker than usual.  The colored mucus you cough up changes from clear or white to yellow, green, gray, or bloody.  You have problems from the medicine you are taking such as:  A rash.  Itching.  Swelling.  Trouble breathing.  You need reliever medicines more than 2-3 times a week.  Your peak flow measurement is still at 50-79% of your personal best after following the action plan for 1 hour.  You have a fever. GET HELP RIGHT AWAY IF:   You seem to be worse and are not responding to medicine during an asthma attack.  You are short of breath even at rest.  You get short  of breath when doing very little activity.  You have trouble eating, drinking, or talking.  You have chest pain.  You have a fast heartbeat.  Your lips or fingernails start to turn blue.  You are light-headed, dizzy, or faint.  Your peak flow is less than 50% of your personal best.   This information is not intended to replace advice given to you by your health care provider. Make sure you discuss any questions you have with your health care provider.   Document Released: 02/18/2008 Document Revised:  05/23/2015 Document Reviewed: 03/31/2013 Elsevier Interactive Patient Education 2016 [employer].   Emergency Department Resource Guide 1) Find a Doctor and Pay Out of Pocket Although you won't have to find out who is covered by your insurance plan, it is a good idea to ask around and get recommendations. You will then need to call the office and see if the doctor you have chosen will accept you as a new patient and what types of options they offer for patients who are self-pay. Some doctors offer discounts or will set up payment plans for their patients who do not have insurance, but you will need to ask so you aren't surprised when you get to your appointment.  2) Contact Your Local Health Department Not all health departments have doctors that can see patients for sick visits, but many do, so it is worth a call to see if yours does. If you don't know where your local health department is, you can check in your phone book. The CDC also has a tool to help you locate your state's health department, and many state websites also have listings of all of their local health departments.  3) Find a [place] [facility] If your illness is not likely to be very severe or complicated, you may want to try a walk in clinic. These are popping up all over the country in pharmacies, drugstores, and shopping centers. They're usually staffed by nurse practitioners or physician assistants that have been trained to treat common illnesses and complaints. They're usually fairly quick and inexpensive. However, if you have serious medical issues or chronic medical problems, these are probably not your best option.  No Primary Care Doctor: - Call Health Connect at  [phone] - they can help you locate a primary care doctor that  accepts your insurance, provides certain services, etc. - Physician Referral Service- [phone]  Chronic Pain Problems: Organization         Address  Phone   Notes  [facility]  [phone] Patients need to be referred by their primary care doctor.   Medication Assistance: Organization         Address  Phone   Notes  [facility] Medication [facility] [address] [place], [place] [address] [phone] --Must be a resident of [facility] -- Must have NO insurance coverage whatsoever (no Medicaid/ Medicare, etc.) -- The pt. MUST have a primary care doctor that directs their care regularly and follows them in the community   MedAssist  [phone]   [employer]  [phone]    Agencies that provide inexpensive medical care: Organization         Address  Phone   Notes  [place]  [phone]   [facility]    [phone]   [facility] [place], [place] [address] [phone]   [place]  of [place] [address], [place] [phone]   Planned Parenthood    [phone]   [facility]    [phone]   [place] and [place] [address], [place] Phone:  [phone], Fax:  [phone] Hours of Operation:  9 am - 6 pm, M-F.  Also accepts Medicaid/Medicare and self-pay.  [facility] for [place] [address], [address], [place] Phone: [phone], Fax: [phone]. Hours of Operation:  8:30 am - 5:30 pm, M-F.  Also accepts Medicaid and self-pay.  [facility] [place] [address] [place] Phone: [phone]   [place], [place], [place] [phone], Ext. [phone] Mondays & Thursdays: 7-9 AM.  First 15 patients are seen on a first come, first serve basis.    [place] Providers:  Organization         Address  Phone   Notes  [facility] [address],  [phone] Also accepts self-pay patients.  [facility] [address], [place]  [phone]   [place], [address], [place] [phone]   [facility] [address], [place] [phone]   Lucianne Lei [address], [place]   [phone] Only accepts [place] Access [place] patients after they have their name applied to their card.   Self-Pay (no insurance) in [place] [facility]:  Organization         Address  Phone   Notes  Sickle Cell Patients, [facility] Internal Medicine [place] [phone]   [facility] [place] [phone]   [facility] [place]  [place], [place], [place] [phone]   [facility]/Dr. Osei-Bonsu  [address] [place] or [address], [place] [phone] Phone number for both [place] and [place] locations is the same.  Urgent Medical and [facility] [address], [place] [phone]   [facility] [address] [place] or [address] [phone]   [facility] [address] [place] [phone], phone; [phone], fax Sees patients 1st and 3rd Saturday of every month.  Must not qualify for public or private insurance (i.e. Medicaid, Medicare, [place] Health Choice, Veterans' Benefits)  Household income should be no more than 200% of the poverty level The clinic cannot treat you if you are pregnant or think you are pregnant  Sexually transmitted diseases are not treated at the clinic.    Dental Care: Organization         Address  Phone  Notes  [facility] [place] [phone] Accepts children up to age 71 who are enrolled in [place] or [place]; pregnant women with a Medicaid card; and children who have applied for Medicaid or  Health  Choice, but were declined, whose parents can pay a reduced fee at time of service.  [facility] [place]  [address], [place] [phone] Accepts children up to age 65 who are enrolled in [place] or [place]; pregnant women with a Medicaid card; and children who  have applied for Medicaid or [place] Health Choice, but were declined, whose parents can pay a reduced fee at time of service.  [facility] Adult Dental Access PROGRAM  [place] [phone] Patients are seen by appointment only. Walk-ins are not accepted. E. Lopez will see patients 56 years of age and older. Monday - Tuesday (8am-5pm) Most Wednesdays (8:30-5pm) $30 per visit, cash only  [facility] Adult Dental Access PROGRAM  [address], [facility] [phone] Patients are seen by appointment only. Walk-ins are not accepted. [place] will see patients 29 years of age and older. One Wednesday Evening (Monthly: Volunteer Based).  $30 per visit, cash only  [place]  [phone] for adults; Children under age 49, call Graduate Pediatric Dentistry at [phone]. Children aged 41-14, please call [phone] to request a pediatric application.  Dental services are provided in all areas of dental care including fillings, crowns and bridges, complete and partial dentures, implants, gum treatment, root canals, and extractions. Preventive care is also provided. Treatment is provided to both adults and children. Patients are selected via a lottery and there is often a waiting list.   [facility] [address], [place]  [phone] [email or web address]   [facility] [address] [place], [place] [phone], Ext. [phone] Second and Fourth Thursday of each month, opens at 6:30 AM; Clinic ends at 9 AM.  Patients are seen on a first-come first-served basis, and a limited number are seen during each  clinic.   [facility]  [address] Hillard Danker [place], [place] [phone]   Eligibility Requirements You must have lived in [place], [place], or [place] counties for at least the last three months.   You cannot be eligible for state or federal sponsored [employer], including [employer], [place], or [employer].   You generally cannot be eligible for healthcare insurance through your employer.    How to apply: Eligibility screenings are held every Tuesday and Wednesday afternoon from 1:00 pm until 4:00 pm. You do not need an appointment for the interview!  [facility] [address] [place], [place]   [place] Department  [place]  [phone]    Behavioral Health Resources in the Community: Intensive Outpatient Programs Organization         Address  Phone  Notes  [place]. [address] [place], [place] [phone]   [facility] [address] [place], [place]   ADS: Alcohol & Drug Svcs [address], [place], [place]   [place] [address]  [place], [place] or [phone]   Substance Abuse Resources Organization         Address  Phone  Notes  Alcohol and Drug Services  [phone]   [place]  [phone]   [facility] [place]   Chinita Pester  [phone]   Residential & Outpatient Substance Abuse Program  [phone]   Psychological Services Organization         Address  Phone  Notes  [facility] [place]  [place]  [phone]   [place] [address] [place] or [phone]    Mobile Crisis Teams Organization  Address  Phone  Notes  [facility]rapeutic Alternatives, Mobile  Crisis Care Unit  [phone]   [facility]  [address] [place], [place]   [facility] [address], [place] [phone]    Self-Help/Support Groups Organization         Address  Phone             Notes  [place] of [place] - variety of support groups  [place] Call for more information  Narcotics Anonymous (NA), [facility] [address], [employer] [place]  2 meetings at this location   Special educational needs teacher         Address  Phone  Notes  [facility]idential [facility] [place],    [place]  [phone]   [facility]  [address] [place] [phone], [place], [place]   [place], [place] [phone] Admissions: 8am-3pm M-F  Incentives Substance [place] [phone],    [place], [place] [phone]   [facility] [address] [place], [place], [place]   [facility] [address],  [place], [place]   [facility] - Intensive Outpatient [address]een [address], [place], [place]   [facility] [place].,  [place], [place] [phone] or [phone]   [facility] Services (RTS) [address], [place], [place] Accepts Medicaid  [facility] [place] [address]  [place] [phone] Substance Abuse/Addiction Treatment   [facility] Organization         Address  Phone  Notes  [facility]  [phone]   Domenic Schwab, PhD [address] Arlis Porta [place], [place]   [phone] or [phone]   [place], [place] [phone]   [facility] [address], [place], [place] [phone] Insurance/Medicaid/sponsorship through [facility] and Families [address], [address]  [place]                                    [place], [place] [phone] [place] [address], [place] [phone]    Dr. Adele Schilder  [phone]   [facility] Dept. 1) [address], [place] 2) [place] 3)  [place] [address], [place] [phone]  [phone]   [place] [phone] or [phone] (After Hours)

## 2015-11-07 NOTE — ED Notes (Signed)
Per EMS: Pt states hx of asthma and states that late last night, she was having trouble sleeping.  Began feeling SOB.  Has not had problems with asthma for several years.  Lung sounds clear per EMS.

## 2015-11-07 NOTE — ED Notes (Signed)
PA at bedside.

## 2015-11-07 NOTE — ED Provider Notes (Signed)
CSN: [id number]     Arrival date & time 11/07/15  1026 History   First MD Initiated Contact with Patient 11/07/15 1523     Chief Complaint  Patient presents with  . Anxiety  . Asthma    HPI   Leah [name] is a 25 y.o. female with a PMH of asthma who presents to the ED with chest tightness and shortness of breath, which she states are consistent with her typical asthma symptoms, as well as abdominal cramping and nausea. She notes her symptoms started last night and have been intermittent since that time. She reports the heat in her house seems to exacerbate her chest tightness and shortness of breath. She states she tried standing in a warm shower for symptom relief. She reports associated "hot and cold sweats." She denies cough, congestion, vomiting, diarrhea, constipation, dysuria, urgency, frequency. She denies recent travel or immobility, recent surgery, history of malignancy, current estrogen use, history of DVT/PE.   Past Medical History  Diagnosis Date  . Asthma   . Herpes genitalia   . Ovarian cyst    Past Surgical History  Procedure Laterality Date  . Eye muscle surgery    . Ovarian cyst removal     Family History  Problem Relation Age of Onset  . Asthma Father   . Hyperlipidemia Father   . Asthma Sister   . Asthma Brother   . Asthma Paternal Aunt   . Asthma Paternal Grandmother   . Cancer Mother    Social History  Substance Use Topics  . Smoking status: Never Smoker   . Smokeless tobacco: None  . Alcohol Use: No   OB History    Gravida Para Term Preterm AB TAB SAB Ectopic Multiple Living   0               Review of Systems  Constitutional: Positive for fever and chills.  HENT: Negative for congestion.   Respiratory: Positive for chest tightness and shortness of breath. Negative for cough.   Cardiovascular: Negative for leg swelling.  Gastrointestinal: Positive for nausea and abdominal pain. Negative for vomiting, diarrhea, constipation and blood in stool.   Genitourinary: Negative for dysuria, urgency and frequency.  All other systems reviewed and are negative.     Allergies  Penicillins  Home Medications   Prior to Admission medications   Medication Sig Start Date End Date Taking? Authorizing Provider  ketorolac (TORADOL) 10 MG tablet Take 1 tablet (10 mg total) by mouth every 8 (eight) hours. 10/18/15  Yes Jennifer I Rasch, NP    BP 109/66 mmHg  Pulse 90  Temp(Src) 98.6 F (37 C) (Oral)  Resp 18  SpO2 99%  LMP 10/18/2015 Physical Exam  Constitutional: She is oriented to person, place, and time. She appears well-developed and well-nourished. No distress.  HENT:  Head: Normocephalic and atraumatic.  Right Ear: External ear normal.  Left Ear: External ear normal.  Nose: Nose normal.  Mouth/Throat: Uvula is midline, oropharynx is clear and moist and mucous membranes are normal.  Eyes: Conjunctivae, EOM and lids are normal. Pupils are equal, round, and reactive to light. Right eye exhibits no discharge. Left eye exhibits no discharge. No scleral icterus.  Neck: Normal range of motion. Neck supple.  Cardiovascular: Normal rate, regular rhythm, normal heart sounds, intact distal pulses and normal pulses.   Pulmonary/Chest: Effort normal. No respiratory distress. She has wheezes. She has no rales.  Mild wheezing to upper lung fields bilaterally.  Abdominal: Soft. Normal appearance and  bowel sounds are normal. She exhibits no distension and no mass. There is no tenderness. There is no rigidity, no rebound and no guarding.  Musculoskeletal: Normal range of motion. She exhibits no edema or tenderness.  Neurological: She is alert and oriented to person, place, and time. She has normal strength. No cranial nerve deficit or sensory deficit.  Skin: Skin is warm, dry and intact. No rash noted. She is not diaphoretic. No erythema. No pallor.  Psychiatric: She has a normal mood and affect. Her speech is normal and behavior is normal.  Nursing  note and vitals reviewed.   ED Course  Procedures (including critical care time)  Labs Review Labs Reviewed  COMPREHENSIVE METABOLIC PANEL - Abnormal; Notable for the following:    Calcium 8.7 (*)    ALT 11 (*)    All other components within normal limits  URINALYSIS, ROUTINE W REFLEX MICROSCOPIC (NOT AT [facility]) - Abnormal; Notable for the following:    Color, Urine AMBER (*)    APPearance CLOUDY (*)    Specific Gravity, Urine 1.043 (*)    Hgb urine dipstick LARGE (*)    Protein, ur 30 (*)    All other components within normal limits  URINE MICROSCOPIC-ADD ON - Abnormal; Notable for the following:    Squamous Epithelial / LPF 6-30 (*)    Bacteria, UA FEW (*)    All other components within normal limits  CBC WITH DIFFERENTIAL/PLATELET  LIPASE, BLOOD  POC URINE PREG, ED    Imaging Review Dg Chest 2 View  11/07/2015  CLINICAL DATA:  Shortness of breath, history of asthma EXAM: CHEST  2 VIEW COMPARISON:  01/19/2014 FINDINGS: The heart size and mediastinal contours are within normal limits. Both lungs are clear. The visualized skeletal structures are unremarkable. IMPRESSION: No active cardiopulmonary disease. Electronically Signed   By: Inez Catalina M.D.   On: 11/07/2015 15:58   I have personally reviewed and evaluated these images and lab results as part of my medical decision-making.   EKG Interpretation   Date/Time:  Wednesday November 07 2015 17:24:22 EST Ventricular Rate:  117 PR Interval:  138 QRS Duration: 72 QT Interval:  298 QTC Calculation: 415 R Axis:   91 Text Interpretation:  Sinus tachycardia Rightward axis Nonspecific T wave  abnormality Abnormal ECG Confirmed by Hazle Coca [phone]) on 11/07/2015  7:13:33 PM      MDM   Final diagnoses:  Asthma exacerbation    25 year old female presents with chest tightness and shortness of breath, which she states are consistent with her typical asthma symptoms. Also notes lower abdominal cramping, which she thinks may  be related to her menstrual cycle.  Patient is afebrile. Tachycardic. Heart regular rhythm. Mild wheezing to upper lung fields bilaterally. No increased work of breathing or respiratory distress. No lower extremity edema.   CBC negative for leukocytosis or anemia. CMP unremarkable. Lipase within normal limits. UA negative for infection, remarkable for large hemoglobin, TNTC RBCs (patient states she is currently on her menstrual cycle). Urine pregnancy negative. EKG sinus tachycardia, rightward axis, nonspecific T wave changes. Chest x-ray no active cardiopulmonary disease.   Patient reports symptom resolution s/p breathing treatment. On reassessment, lungs clear. Symptoms likely due to asthma. Patient discussed with Dr. Ralene Bathe. Low suspicion for PE, as patient has no significant risk factors. Patient is non-toxic and well-appearing, feel she is stable for discharge at this time. Patient to follow-up with PCP regarding EKG findings. Strict return precautions discussed. Patient verbalizes her understanding  and is in agreement with plan.  BP 109/66 mmHg  Pulse 90  Temp(Src) 98.6 F (37 C) (Oral)  Resp 18  SpO2 99%  LMP 10/18/2015     Marella Chimes, PA-C 11/08/15 0105  Quintella Reichert, MD 11/09/15 0110

## 2015-11-07 NOTE — ED Notes (Signed)
Pt called, no answer. 1st attempt. 

## 2015-11-07 NOTE — ED Notes (Signed)
Patient was alert, oriented and stable upon discharge. RN went over AVS and patient had no further questions.  

## 2015-11-07 NOTE — ED Notes (Signed)
ED PA at bedside

## 2015-11-23 ENCOUNTER — Ambulatory Visit (INDEPENDENT_AMBULATORY_CARE_PROVIDER_SITE_OTHER): Payer: 59 | Admitting: Physician Assistant

## 2015-11-23 DIAGNOSIS — Z8619 Personal history of other infectious and parasitic diseases: Secondary | ICD-10-CM

## 2015-11-23 DIAGNOSIS — Z130 Encounter for screening for diseases of the blood and blood-forming organs and certain disorders involving the immune mechanism: Secondary | ICD-10-CM

## 2015-11-23 DIAGNOSIS — Z0000 Encounter for general adult medical examination without abnormal findings: Secondary | ICD-10-CM | POA: Diagnosis not present

## 2015-11-23 DIAGNOSIS — Z13228 Encounter for screening for other metabolic disorders: Secondary | ICD-10-CM

## 2015-11-23 DIAGNOSIS — Z1329 Encounter for screening for other suspected endocrine disorder: Secondary | ICD-10-CM | POA: Diagnosis not present

## 2015-11-23 DIAGNOSIS — Z13220 Encounter for screening for lipoid disorders: Secondary | ICD-10-CM

## 2015-11-23 NOTE — Patient Instructions (Addendum)
Health Maintenance, Female Adopting a healthy lifestyle and getting preventive care can go a long way to promote health and wellness. Talk with your health care provider about what schedule of regular examinations is right for you. This is a good chance for you to check in with your provider about disease prevention and staying healthy. In between checkups, there are plenty of things you can do on your own. Experts have done a lot of research about which lifestyle changes and preventive measures are most likely to keep you healthy. Ask your health care provider for more information. WEIGHT AND DIET  Eat a healthy diet  Be sure to include plenty of vegetables, fruits, low-fat dairy products, and lean protein.  Do not eat a lot of foods high in solid fats, added sugars, or salt.  Get regular exercise. This is one of the most important things you can do for your health.  Most adults should exercise for at least 150 minutes each week. The exercise should increase your heart rate and make you sweat (moderate-intensity exercise).  Most adults should also do strengthening exercises at least twice a week. This is in addition to the moderate-intensity exercise.  Maintain a healthy weight  Body mass index (BMI) is a measurement that can be used to identify possible weight problems. It estimates body fat based on height and weight. Your health care provider can help determine your BMI and help you achieve or maintain a healthy weight.  For females 20 years of age and older:   A BMI below 18.5 is considered underweight.  A BMI of 18.5 to 24.9 is normal.  A BMI of 25 to 29.9 is considered overweight.  A BMI of 30 and above is considered obese.  Watch levels of cholesterol and blood lipids  You should start having your blood tested for lipids and cholesterol at 23 years of age, then have this test every 5 years.  You may need to have your cholesterol levels checked more often if:  Your lipid  or cholesterol levels are high.  You are older than 23 years of age.  You are at high risk for heart disease.  CANCER SCREENING   Lung Cancer  Lung cancer screening is recommended for adults 55-80 years old who are at high risk for lung cancer because of a history of smoking.  A yearly low-dose CT scan of the lungs is recommended for people who:  Currently smoke.  Have quit within the past 15 years.  Have at least a 30-pack-year history of smoking. A pack year is smoking an average of one pack of cigarettes a day for 1 year.  Yearly screening should continue until it has been 15 years since you quit.  Yearly screening should stop if you develop a health problem that would prevent you from having lung cancer treatment.  Breast Cancer  Practice breast self-awareness. This means understanding how your breasts normally appear and feel.  It also means doing regular breast self-exams. Let your health care provider know about any changes, no matter how small.  If you are in your 20s or 30s, you should have a clinical breast exam (CBE) by a health care provider every 1-3 years as part of a regular health exam.  If you are 40 or older, have a CBE every year. Also consider having a breast X-ray (mammogram) every year.  If you have a family history of breast cancer, talk to your health care provider about genetic screening.  If you   are at high risk for breast cancer, talk to your health care provider about having an MRI and a mammogram every year.  Breast cancer gene (BRCA) assessment is recommended for women who have family members with BRCA-related cancers. BRCA-related cancers include:  Breast.  Ovarian.  Tubal.  Peritoneal cancers.  Results of the assessment will determine the need for genetic counseling and BRCA1 and BRCA2 testing. Cervical Cancer Your health care provider may recommend that you be screened regularly for cancer of the pelvic organs (ovaries, uterus, and  vagina). This screening involves a pelvic examination, including checking for microscopic changes to the surface of your cervix (Pap test). You may be encouraged to have this screening done every 3 years, beginning at age 21.  For women ages 30-65, health care providers may recommend pelvic exams and Pap testing every 3 years, or they may recommend the Pap and pelvic exam, combined with testing for human papilloma virus (HPV), every 5 years. Some types of HPV increase your risk of cervical cancer. Testing for HPV may also be done on women of any age with unclear Pap test results.  Other health care providers may not recommend any screening for nonpregnant women who are considered low risk for pelvic cancer and who do not have symptoms. Ask your health care provider if a screening pelvic exam is right for you.  If you have had past treatment for cervical cancer or a condition that could lead to cancer, you need Pap tests and screening for cancer for at least 20 years after your treatment. If Pap tests have been discontinued, your risk factors (such as having a new sexual partner) need to be reassessed to determine if screening should resume. Some women have medical problems that increase the chance of getting cervical cancer. In these cases, your health care provider may recommend more frequent screening and Pap tests. Colorectal Cancer  This type of cancer can be detected and often prevented.  Routine colorectal cancer screening usually begins at 23 years of age and continues through 23 years of age.  Your health care provider may recommend screening at an earlier age if you have risk factors for colon cancer.  Your health care provider may also recommend using home test kits to check for hidden blood in the stool.  A small camera at the end of a tube can be used to examine your colon directly (sigmoidoscopy or colonoscopy). This is done to check for the earliest forms of colorectal  cancer.  Routine screening usually begins at age 50.  Direct examination of the colon should be repeated every 5-10 years through 23 years of age. However, you may need to be screened more often if early forms of precancerous polyps or small growths are found. Skin Cancer  Check your skin from head to toe regularly.  Tell your health care provider about any new moles or changes in moles, especially if there is a change in a mole's shape or color.  Also tell your health care provider if you have a mole that is larger than the size of a pencil eraser.  Always use sunscreen. Apply sunscreen liberally and repeatedly throughout the day.  Protect yourself by wearing long sleeves, pants, a wide-brimmed hat, and sunglasses whenever you are outside. HEART DISEASE, DIABETES, AND HIGH BLOOD PRESSURE   High blood pressure causes heart disease and increases the risk of stroke. High blood pressure is more likely to develop in:  People who have blood pressure in the high end   of the normal range (130-139/85-89 mm Hg).  People who are overweight or obese.  People who are African American.  If you are 38-23 years of age, have your blood pressure checked every 3-5 years. If you are 61 years of age or older, have your blood pressure checked every year. You should have your blood pressure measured twice--once when you are at a hospital or clinic, and once when you are not at a hospital or clinic. Record the average of the two measurements. To check your blood pressure when you are not at a hospital or clinic, you can use:  An automated blood pressure machine at a pharmacy.  A home blood pressure monitor.  If you are between 45 years and 39 years old, ask your health care provider if you should take aspirin to prevent strokes.  Have regular diabetes screenings. This involves taking a blood sample to check your fasting blood sugar level.  If you are at a normal weight and have a low risk for diabetes,  have this test once every three years after 23 years of age.  If you are overweight and have a high risk for diabetes, consider being tested at a younger age or more often. PREVENTING INFECTION  Hepatitis B  If you have a higher risk for hepatitis B, you should be screened for this virus. You are considered at high risk for hepatitis B if:  You were born in a country where hepatitis B is common. Ask your health care provider which countries are considered high risk.  Your parents were born in a high-risk country, and you have not been immunized against hepatitis B (hepatitis B vaccine).  You have HIV or AIDS.  You use needles to inject street drugs.  You live with someone who has hepatitis B.  You have had sex with someone who has hepatitis B.  You get hemodialysis treatment.  You take certain medicines for conditions, including cancer, organ transplantation, and autoimmune conditions. Hepatitis C  Blood testing is recommended for:  Everyone born from [birth year] through 1963.  Anyone with known risk factors for hepatitis C. Sexually transmitted infections (STIs)  You should be screened for sexually transmitted infections (STIs) including gonorrhea and chlamydia if:  You are sexually active and are younger than 24 years of age.  You are older than 23 years of age and your health care provider tells you that you are at risk for this type of infection.  Your sexual activity has changed since you were last screened and you are at an increased risk for chlamydia or gonorrhea. Ask your health care provider if you are at risk.  If you do not have HIV, but are at risk, it may be recommended that you take a prescription medicine daily to prevent HIV infection. This is called pre-exposure prophylaxis (PrEP). You are considered at risk if:  You are sexually active and do not regularly use condoms or know the HIV status of your partner(s).  You take drugs by injection.  You are sexually  active with a partner who has HIV. Talk with your health care provider about whether you are at high risk of being infected with HIV. If you choose to begin PrEP, you should first be tested for HIV. You should then be tested every 3 months for as long as you are taking PrEP.  PREGNANCY   If you are premenopausal and you may become pregnant, ask your health care provider about preconception counseling.  If you may  become pregnant, take 400 to 800 micrograms (mcg) of folic acid every day.  If you want to prevent pregnancy, talk to your health care provider about birth control (contraception). OSTEOPOROSIS AND MENOPAUSE   Osteoporosis is a disease in which the bones lose minerals and strength with aging. This can result in serious bone fractures. Your risk for osteoporosis can be identified using a bone density scan.  If you are 65 years of age or older, or if you are at risk for osteoporosis and fractures, ask your health care provider if you should be screened.  Ask your health care provider whether you should take a calcium or vitamin D supplement to lower your risk for osteoporosis.  Menopause may have certain physical symptoms and risks.  Hormone replacement therapy may reduce some of these symptoms and risks. Talk to your health care provider about whether hormone replacement therapy is right for you.  HOME CARE INSTRUCTIONS   Schedule regular health, dental, and eye exams.  Stay current with your immunizations.   Do not use any tobacco products including cigarettes, chewing tobacco, or electronic cigarettes.  If you are pregnant, do not drink alcohol.  If you are breastfeeding, limit how much and how often you drink alcohol.  Limit alcohol intake to no more than 1 drink per day for nonpregnant women. One drink equals 12 ounces of beer, 5 ounces of wine, or 1 ounces of hard liquor.  Do not use street drugs.  Do not share needles.  Ask your health care provider for help if  you need support or information about quitting drugs.  Tell your health care provider if you often feel depressed.  Tell your health care provider if you have ever been abused or do not feel safe at home.   This information is not intended to replace advice given to you by your health care provider. Make sure you discuss any questions you have with your health care provider.   Document Released: 03/17/2011 Document Revised: 09/22/2014 Document Reviewed: 08/03/2013 Elsevier Interactive Patient Education 2016 [employer]    IF you received an x-ray today, you will receive an invoice from [facility]. Please contact  Radiology at [phone] with questions or concerns regarding your invoice.   IF you received labwork today, you will receive an invoice from [facility]. Please contact [facility] at [phone] with questions or concerns regarding your invoice.   Our billing staff will not be able to assist you with questions regarding bills from these companies.  You will be contacted with the lab results as soon as they are available. The fastest way to get your results is to activate your My Chart account. Instructions are located on the last page of this paperwork. If you have not heard from us regarding the results in 2 weeks, please contact this office.     

## 2015-11-23 NOTE — Progress Notes (Signed)
Leah [name]  MRN: [id number] DOB: 04/02/1991  Subjective:  Pt presents to clinic for a CPE.  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  Neg gonorrhea and chlamydia and trich in 10/2015 ED visit.   [employer] - current student  Last dental exam: every 6 months Last vision exam: wears glasses - 04/2015 Last pap smear: 02/2015 - normal  Vaccinations      Tetanus - 4 years ago      HPV - all 3 - 2012  Pt has h/o genital lesion with a positive culture and she has been taking medications for the last 5-6y.  She is wondering if she has to continue these medications.  Her only partner at the time tested negative and he was her 1st sexual partner.  She would like to have blood testing today to confirm.  Patient Active Problem List   Diagnosis Date Noted  . Dermoid cyst of left ovary 11/23/2013    No current outpatient prescriptions on file prior to visit.   No current facility-administered medications on file prior to visit.    Allergies  Allergen Reactions  . Penicillins Other (See Comments)    Family allergy  Has patient had a PCN reaction causing immediate rash, facial/tongue/throat swelling, SOB or lightheadedness with hypotension: No Has patient had a PCN reaction causing severe rash involving mucus membranes or skin necrosis: No Has patient had a PCN reaction that required hospitalization No Has patient had a PCN reaction occurring within the last 10 years: No If all of the above answers are "NO", then may proceed with Cephalosporin use.    Social History   Social History  . Marital Status: Single    Spouse Name: N/A  . Number of Children: N/A  . Years of Education: N/A   Social History Main Topics  . Smoking status: Never Smoker   . Smokeless tobacco: None  . Alcohol Use: No     Comment: ETOh once a week, no binge drinker  . Drug Use: No  . Sexual Activity:    Partners: Female, Female    Birth Control/  Protection:    Other Topics Concern  . None   Social History Narrative   Single   Current student at [employer]       Past Surgical History  Procedure Laterality Date  . Eye muscle surgery    . Ovarian cyst removal      Family History  Problem Relation Age of Onset  . Asthma Father   . Hyperlipidemia Father   . Asthma Brother   . Asthma Paternal Aunt   . Asthma Paternal Grandmother   . Cancer Mother     lung  . Asthma Sister     Review of Systems  Constitutional: Negative.   HENT: Negative.   Eyes: Negative.   Respiratory: Negative.   Cardiovascular: Negative.   Gastrointestinal: Negative.   Endocrine: Negative.   Genitourinary: Negative.   Musculoskeletal: Negative.   Skin: Negative.   Allergic/Immunologic: Negative.   Neurological: Negative.   Hematological: Negative.   Psychiatric/Behavioral: Negative.    Objective:  BP 118/60 mmHg  Pulse 88  Temp(Src) 97.9 F (36.6 C) (Oral)  Resp 16  Ht 5' 5.5" (1.664 m)  Wt 133 lb 9.6 oz (60.601 kg)  BMI 21.89 kg/m2  SpO2 99%  LMP 11/16/2015  Physical Exam  Constitutional: She is oriented to person, place, and time  and well-developed, well-nourished, and in no distress.  HENT:  Head: Normocephalic and atraumatic.  Right Ear: Hearing, tympanic membrane, external ear and ear canal normal.  Left Ear: Hearing, tympanic membrane, external ear and ear canal normal.  Nose: Nose normal.  Mouth/Throat: Uvula is midline, oropharynx is clear and moist and mucous membranes are normal.  Eyes: Conjunctivae and EOM are normal. Pupils are equal, round, and reactive to light.  Neck: Trachea normal and normal range of motion. Neck supple. No thyroid mass and no thyromegaly present.  Cardiovascular: Normal rate, regular rhythm and normal heart sounds.   No murmur heard. Pulmonary/Chest: Effort normal and breath sounds normal. She has no wheezes.  Abdominal: Soft. Bowel sounds are normal. There is no tenderness.  Musculoskeletal:  Normal range of motion.  Lymphadenopathy:    She has no cervical adenopathy.  Neurological: She is alert and oriented to person, place, and time. She has normal motor skills, normal sensation, normal strength and normal reflexes. Gait normal.  Skin: Skin is warm and dry.  Psychiatric: Mood, memory, affect and judgment normal.    Visual Acuity Screening   Right eye Left eye Both eyes  Without correction:     With correction: 20/25 20/25 20/20     Assessment and Plan :  Annual physical exam  History of herpes genitalis - Plan: HSV(herpes simplex vrs) 1+2 ab-IgG - if she wants to take as needed she will use Valtrex 500mg  bid for 3 days per outbreak  Screening, deficiency anemia, iron - Plan: CBC with Differential/Platelet  Screening for metabolic disorder - Plan: COMPLETE METABOLIC PANEL WITH GFR  Screening cholesterol level - Plan: Lipid panel  Screening for thyroid disorder - Plan: TSH  Forms filled out  Windell Hummingbird PA-C  [facility] 11/23/2015 5:13 PM

## 2015-11-23 NOTE — Progress Notes (Deleted)
   Subjective:    Patient ID: Leah [name], female    DOB: 1991/08/16, 25 y.o.   MRN: [id number]  HPI    Review of Systems     Objective:   Physical Exam        Assessment & Plan:  Tuberculosis Risk Questionnaire  1. Were you born outside the [place] in one of the following parts of the world:    [place], [place], [place], [place] or [place]?  No  2. Have you traveled outside the [place] and lived for more than one month in one of the following parts of the world:  [place], [place], [place], [place] or [place]?  No  3. Do you have a compromised immune system such as from any of the following conditions:  HIV/AIDS, organ or bone marrow transplantation, diabetes, immunosuppressive   medicines (e.g. Prednisone, Remicaide), leukemia, lymphoma, cancer of the   head or neck, gastrectomy or jejunal bypass, end-stage renal disease (on   dialysis), or silicosis?  No    4. Have you ever done one of the following:    Used crack cocaine, injected illegal drugs, worked or resided in jail or prison,   worked or resided at a homeless shelter, or worked as a Dietitian in   direct contact with patients?  No  5. Have you ever been exposed to anyone with infectious tuberculosis?  No

## 2015-11-23 NOTE — Progress Notes (Signed)

## 2015-11-24 LAB — COMPLETE METABOLIC PANEL WITH GFR
ALBUMIN: 4 g/dL (ref 3.6–5.1)
ALK PHOS: 43 U/L (ref 33–115)
ALT: 10 U/L (ref 6–29)
AST: 15 U/L (ref 10–30)
BUN: 8 mg/dL (ref 7–25)
CO2: 26 mmol/L (ref 20–31)
Calcium: 9.3 mg/dL (ref 8.6–10.2)
Chloride: 104 mmol/L (ref 98–110)
Creat: 0.83 mg/dL (ref 0.50–1.10)
GFR, Est African American: >89 mL/min (ref >=60)
GFR, Est Non African American: >89 mL/min (ref >=60)
GLUCOSE: 87 mg/dL (ref 65–99)
POTASSIUM: 4.8 mmol/L (ref 3.5–5.3)
SODIUM: 139 mmol/L (ref 135–146)
TOTAL PROTEIN: 7 g/dL (ref 6.1–8.1)
Total Bilirubin: 0.2 mg/dL (ref 0.2–1.2)

## 2015-11-24 LAB — CBC WITH DIFFERENTIAL/PLATELET
BASOS PCT: 1 % (ref 0–1)
Basophils Absolute: 0.1 10*3/uL (ref 0.0–0.1)
Eosinophils Absolute: 0.2 10*3/uL (ref 0.0–0.7)
Eosinophils Relative: 4 % (ref 0–5)
HCT: 38.3 % (ref 36.0–46.0)
HEMOGLOBIN: 12.4 g/dL (ref 12.0–15.0)
Lymphocytes Relative: 46 % (ref 12–46)
Lymphs Abs: 2.8 10*3/uL (ref 0.7–4.0)
MCH: 27.9 pg (ref 26.0–34.0)
MCHC: 32.4 g/dL (ref 30.0–36.0)
MCV: 86.3 fL (ref 78.0–100.0)
MPV: 9.4 fL (ref 8.6–12.4)
Monocytes Absolute: 0.7 10*3/uL (ref 0.1–1.0)
Monocytes Relative: 11 % (ref 3–12)
NEUTROS ABS: 2.3 10*3/uL (ref 1.7–7.7)
NEUTROS PCT: 38 % — AB (ref 43–77)
Platelets: 374 10*3/uL (ref 150–400)
RBC: 4.44 MIL/uL (ref 3.87–5.11)
RDW: 13.1 % (ref 11.5–15.5)
WBC: 6.1 10*3/uL (ref 4.0–10.5)

## 2015-11-24 LAB — LIPID PANEL
CHOL/HDL RATIO: 2.6 ratio (ref <=5.0)
Cholesterol: 116 mg/dL — ABNORMAL LOW (ref 125–200)
HDL: 44 mg/dL — ABNORMAL LOW (ref >=46)
LDL CALC: 57 mg/dL (ref <130)
Triglycerides: 76 mg/dL (ref <150)
VLDL: 15 mg/dL (ref <30)

## 2015-11-24 LAB — TSH: TSH: 1.28 m[IU]/L

## 2015-11-26 LAB — HSV(HERPES SIMPLEX VRS) I + II AB-IGG
HSV 1 Glycoprotein G Ab, IgG: <0.9 Index (ref <0.90)
HSV 2 GLYCOPROTEIN G AB, IGG: 5.56 {index} — AB (ref <0.90)

## 2015-12-13 ENCOUNTER — Other Ambulatory Visit: Payer: Self-pay | Admitting: *Deleted

## 2015-12-13 DIAGNOSIS — N939 Abnormal uterine and vaginal bleeding, unspecified: Secondary | ICD-10-CM

## 2015-12-13 DIAGNOSIS — R102 Pelvic and perineal pain: Secondary | ICD-10-CM

## 2015-12-24 ENCOUNTER — Encounter: Payer: Self-pay | Admitting: Obstetrics & Gynecology

## 2015-12-24 ENCOUNTER — Ambulatory Visit: Payer: 59 | Admitting: Obstetrics & Gynecology

## 2015-12-24 ENCOUNTER — Ambulatory Visit (HOSPITAL_COMMUNITY)
Admission: RE | Admit: 2015-12-24 | Discharge: 2015-12-24 | Disposition: A | Discharge status: Home / Self Care | Payer: 59 | Source: Ambulatory Visit | Attending: Obstetrics & Gynecology | Admitting: Obstetrics & Gynecology

## 2015-12-24 DIAGNOSIS — N939 Abnormal uterine and vaginal bleeding, unspecified: Secondary | ICD-10-CM | POA: Diagnosis not present

## 2015-12-24 DIAGNOSIS — R102 Pelvic and perineal pain: Secondary | ICD-10-CM | POA: Diagnosis not present

## 2015-12-24 NOTE — Progress Notes (Signed)
   Subjective:    Patient ID: Leah [name], female    DOB: [birth year], 26 y.o.   MRN: [id number]  HPI 25 yo SAAP0 here for results of her u/s which was done less than an hour ago. The results are not available at the present time and radiology says that it will be at least 30 before the results are available. The patient says that she cannot wait and will reschedule her appt.   Review of Systems     Objective:   Physical Exam        Assessment & Plan:

## 2016-02-12 ENCOUNTER — Emergency Department (HOSPITAL_COMMUNITY)
Admission: EM | Admit: 2016-02-12 | Discharge: 2016-02-12 | Disposition: A | Discharge status: Home / Self Care | Payer: 59 | Attending: Emergency Medicine | Admitting: Emergency Medicine

## 2016-02-12 ENCOUNTER — Encounter (HOSPITAL_COMMUNITY): Payer: Self-pay | Admitting: *Deleted

## 2016-02-12 DIAGNOSIS — Z79899 Other long term (current) drug therapy: Secondary | ICD-10-CM | POA: Insufficient documentation

## 2016-02-12 DIAGNOSIS — J45909 Unspecified asthma, uncomplicated: Secondary | ICD-10-CM | POA: Diagnosis not present

## 2016-02-12 DIAGNOSIS — R350 Frequency of micturition: Secondary | ICD-10-CM | POA: Diagnosis present

## 2016-02-12 DIAGNOSIS — N3001 Acute cystitis with hematuria: Secondary | ICD-10-CM | POA: Diagnosis not present

## 2016-02-12 LAB — URINE MICROSCOPIC-ADD ON

## 2016-02-12 LAB — URINALYSIS, ROUTINE W REFLEX MICROSCOPIC
BILIRUBIN URINE: NEGATIVE
GLUCOSE, UA: NEGATIVE mg/dL
Ketones, ur: NEGATIVE mg/dL
NITRITE: NEGATIVE
PH: 6.5 (ref 5.0–8.0)
SPECIFIC GRAVITY, URINE: 1.022 (ref 1.005–1.030)

## 2016-02-12 LAB — POC URINE PREG, ED: Preg Test, Ur: NEGATIVE

## 2016-02-12 MED ORDER — NITROFURANTOIN MONOHYD MACRO 100 MG PO CAPS
100.0000 mg | ORAL_CAPSULE | Freq: Once | ORAL | Status: AC
  Administered 2016-02-12: 100 mg via ORAL
  Filled 2016-02-12: qty 1

## 2016-02-12 MED ORDER — PHENAZOPYRIDINE HCL 200 MG PO TABS
200.0000 mg | ORAL_TABLET | Freq: Three times a day (TID) | ORAL | Status: DC
  Administered 2016-02-12: 200 mg via ORAL
  Filled 2016-02-12: qty 1

## 2016-02-12 MED ORDER — PHENAZOPYRIDINE HCL 200 MG PO TABS: 200.0000 mg | ORAL_TABLET | Freq: Three times a day (TID) | ORAL | Status: DC

## 2016-02-12 MED ORDER — NITROFURANTOIN MONOHYD MACRO 100 MG PO CAPS: 100.0000 mg | ORAL_CAPSULE | Freq: Two times a day (BID) | ORAL | Status: DC

## 2016-02-12 NOTE — ED Notes (Signed)
Pt reports urinary frequency and burning since today. Denies fevers.

## 2016-02-12 NOTE — ED Provider Notes (Signed)
CSN: [id number]     Arrival date & time 02/12/16  0040 History  By signing my name below, I, [facility], attest that this documentation has been prepared under the direction and in the presence of Delora Fuel, MD. Electronically Signed: Virgel Bouquet, ED Scribe. 02/12/2016. 2:17 AM.   Chief Complaint  Patient presents with  . Urinary Frequency    The history is provided by the patient. No language interpreter was used.  HPI Comments: Leah [name] is a 25 y.o. female who presents to the Emergency Department complaining of intermittent, moderate, burning dysuria onset yesterday. She reports associated suprapubic abdominal pain, urinary frequency, urinary urgency, and decreased appetite. She is currently on birth control. Denies fevers, nausea, vomiting, vaginal discharge, or any other symptoms currently.   Past Medical History  Diagnosis Date  . Asthma   . Herpes genitalia   . Ovarian cyst    Past Surgical History  Procedure Laterality Date  . Eye muscle surgery    . Ovarian cyst removal     Family History  Problem Relation Age of Onset  . Asthma Father   . Hyperlipidemia Father   . Asthma Brother   . Asthma Paternal Aunt   . Asthma Paternal Grandmother   . Cancer Mother     lung  . Asthma Sister    Social History  Substance Use Topics  . Smoking status: Never Smoker   . Smokeless tobacco: None  . Alcohol Use: No     Comment: ETOh once a week, no binge drinker   OB History    Gravida Para Term Preterm AB TAB SAB Ectopic Multiple Living   0              Review of Systems  Constitutional: Positive for appetite change. Negative for fever.  Gastrointestinal: Positive for abdominal pain. Negative for nausea and vomiting.  Genitourinary: Positive for dysuria, urgency and frequency. Negative for vaginal discharge.  All other systems reviewed and are negative.     Allergies  Penicillins  Home Medications   Prior to Admission medications    Medication Sig Start Date End Date Taking? Authorizing Provider  albuterol (PROVENTIL HFA;VENTOLIN HFA) 108 (90 Base) MCG/ACT inhaler Inhale 2 puffs into the lungs every 6 (six) hours as needed for wheezing or shortness of breath.    Historical Provider, MD   BP 113/70 mmHg  Pulse 96  Temp(Src) 98.7 F (37.1 C) (Oral)  Resp 17  Ht 5\' 6"  (1.676 m)  Wt 135 lb (61.236 kg)  BMI 21.80 kg/m2  SpO2 100%  LMP 11/25/2015 Physical Exam  Constitutional: She is oriented to person, place, and time. She appears well-developed and well-nourished. No distress.  HENT:  Head: Normocephalic and atraumatic.  Eyes: Conjunctivae and EOM are normal. Pupils are equal, round, and reactive to light.  Neck: Normal range of motion. Neck supple. No JVD present.  Cardiovascular: Normal rate, regular rhythm and normal heart sounds.   No murmur heard. Pulmonary/Chest: Effort normal and breath sounds normal. She has no wheezes. She has no rales. She exhibits no tenderness.  Abdominal: Soft. Bowel sounds are normal. She exhibits no distension and no mass. There is tenderness in the suprapubic area. There is no CVA tenderness.  Mild suprapubic tenderness. No CVA tenderness.  Musculoskeletal: Normal range of motion. She exhibits no edema.  Lymphadenopathy:    She has no cervical adenopathy.  Neurological: She is alert and oriented to person, place, and time. No cranial nerve deficit. She exhibits normal  muscle tone. Coordination normal.  Skin: Skin is warm and dry. No rash noted.  Psychiatric: She has a normal mood and affect. Her behavior is normal. Judgment and thought content normal.  Nursing note and vitals reviewed.   ED Course  Procedures   DIAGNOSTIC STUDIES: Oxygen Saturation is 100% on RA, normal by my interpretation.    COORDINATION OF CARE: 2:11 AM Discussed results of labs. Will prescribe and order Macrobid and Pyridium. Discussed treatment plan with pt at bedside and pt agreed to plan.   Labs  Review Results for orders placed or performed during the hospital encounter of 02/12/16  Urinalysis, Routine w reflex microscopic- may I&O cath if menses  Result Value Ref Range   Color, Urine YELLOW YELLOW   APPearance TURBID (A) CLEAR   Specific Gravity, Urine 1.022 1.005 - 1.030   pH 6.5 5.0 - 8.0   Glucose, UA NEGATIVE NEGATIVE mg/dL   Hgb urine dipstick LARGE (A) NEGATIVE   Bilirubin Urine NEGATIVE NEGATIVE   Ketones, ur NEGATIVE NEGATIVE mg/dL   Protein, ur >300 (A) NEGATIVE mg/dL   Nitrite NEGATIVE NEGATIVE   Leukocytes, UA LARGE (A) NEGATIVE  Urine microscopic-add on  Result Value Ref Range   Squamous Epithelial / LPF 0-5 (A) NONE SEEN   WBC, UA TOO NUMEROUS TO COUNT 0 - 5 WBC/hpf   RBC / HPF TOO NUMEROUS TO COUNT 0 - 5 RBC/hpf   Bacteria, UA FEW (A) NONE SEEN  POC urine preg, ED  Result Value Ref Range   Preg Test, Ur NEGATIVE NEGATIVE   I have personally reviewed and evaluated these lab results as part of my medical decision-making.    MDM   Final diagnoses:  Acute cystitis with hematuria    Symptoms consistent with cystitis. Old records are reviewed confirming prior ED visit for urinary tract infection. Urinalysis is come back positive for too numerous to count RBCs and too numerous to count WBCs as well as few bacteria. Incidental finding of proteinuria I will need to be evaluated after infection is cleared. She is discharged with prescriptions for nitrofurantoin and phenazopyridine.  I personally performed the services described in this documentation, which was scribed in my presence. The recorded information has been reviewed and is accurate.      Delora Fuel, MD [id number]

## 2016-02-12 NOTE — ED Notes (Signed)
MD at bedside. 

## 2016-02-12 NOTE — Discharge Instructions (Signed)
Urinary Tract Infection Urinary tract infections (UTIs) can develop anywhere along your urinary tract. Your urinary tract is your body's drainage system for removing wastes and extra water. Your urinary tract includes two kidneys, two ureters, a bladder, and a urethra. Your kidneys are a pair of bean-shaped organs. Each kidney is about the size of your fist. They are located below your ribs, one on each side of your spine. CAUSES Infections are caused by microbes, which are microscopic organisms, including fungi, viruses, and bacteria. These organisms are so small that they can only be seen through a microscope. Bacteria are the microbes that most commonly cause UTIs. SYMPTOMS  Symptoms of UTIs may vary by age and gender of the patient and by the location of the infection. Symptoms in young women typically include a frequent and intense urge to urinate and a painful, burning feeling in the bladder or urethra during urination. Older women and men are more likely to be tired, shaky, and weak and have muscle aches and abdominal pain. A fever may mean the infection is in your kidneys. Other symptoms of a kidney infection include pain in your back or sides below the ribs, nausea, and vomiting. DIAGNOSIS To diagnose a UTI, your caregiver will ask you about your symptoms. Your caregiver will also ask you to provide a urine sample. The urine sample will be tested for bacteria and white blood cells. White blood cells are made by your body to help fight infection. TREATMENT  Typically, UTIs can be treated with medication. Because most UTIs are caused by a bacterial infection, they usually can be treated with the use of antibiotics. The choice of antibiotic and length of treatment depend on your symptoms and the type of bacteria causing your infection. HOME CARE INSTRUCTIONS  If you were prescribed antibiotics, take them exactly as your caregiver instructs you. Finish the medication even if you feel better after  you have only taken some of the medication.  Drink enough water and fluids to keep your urine clear or pale yellow.  Avoid caffeine, tea, and carbonated beverages. They tend to irritate your bladder.  Empty your bladder often. Avoid holding urine for long periods of time.  Empty your bladder before and after sexual intercourse.  After a bowel movement, women should cleanse from front to back. Use each tissue only once. SEEK MEDICAL CARE IF:   You have back pain.  You develop a fever.  Your symptoms do not begin to resolve within 3 days. SEEK IMMEDIATE MEDICAL CARE IF:   You have severe back pain or lower abdominal pain.  You develop chills.  You have nausea or vomiting.  You have continued burning or discomfort with urination. MAKE SURE YOU:   Understand these instructions.  Will watch your condition.  Will get help right away if you are not doing well or get worse.   This information is not intended to replace advice given to you by your health care provider. Make sure you discuss any questions you have with your health care provider.   Document Released: 06/11/2005 Document Revised: 05/23/2015 Document Reviewed: 10/10/2011 Elsevier Interactive Patient Education 2016 Elsevier Inc.  Nitrofurantoin tablets or capsules What is this medicine? NITROFURANTOIN (nye troe fyoor AN toyn) is an antibiotic. It is used to treat urinary tract infections. This medicine may be used for other purposes; ask your health care provider or pharmacist if you have questions. What should I tell my health care provider before I take this medicine? They need to  know if you have any of these conditions: -anemia -diabetes -glucose-6-phosphate dehydrogenase deficiency -kidney disease -liver disease -lung disease -other chronic illness -an unusual or allergic reaction to nitrofurantoin, other antibiotics, other medicines, foods, dyes or preservatives -pregnant or trying to get  pregnant -breast-feeding How should I use this medicine? Take this medicine by mouth with a glass of water. Follow the directions on the prescription label. Take this medicine with food or milk. Take your doses at regular intervals. Do not take your medicine more often than directed. Do not stop taking except on your doctor's advice. Talk to your pediatrician regarding the use of this medicine in children. While this drug may be prescribed for selected conditions, precautions do apply. Overdosage: If you think you have taken too much of this medicine contact a poison control center or emergency room at once. NOTE: This medicine is only for you. Do not share this medicine with others. What if I miss a dose? If you miss a dose, take it as soon as you can. If it is almost time for your next dose, take only that dose. Do not take double or extra doses. What may interact with this medicine? -antacids containing magnesium trisilicate -probenecid -quinolone antibiotics like ciprofloxacin, lomefloxacin, norfloxacin and ofloxacin -sulfinpyrazone This list may not describe all possible interactions. Give your health care provider a list of all the medicines, herbs, non-prescription drugs, or dietary supplements you use. Also tell them if you smoke, drink alcohol, or use illegal drugs. Some items may interact with your medicine. What should I watch for while using this medicine? Tell your doctor or health care professional if your symptoms do not improve or if you get new symptoms. Drink several glasses of water a day. If you are taking this medicine for a long time, visit your doctor for regular checks on your progress. If you are diabetic, you may get a false positive result for sugar in your urine with certain brands of urine tests. Check with your doctor. What side effects may I notice from receiving this medicine? Side effects that you should report to your doctor or health care professional as soon as  possible: -allergic reactions like skin rash or hives, swelling of the face, lips, or tongue -chest pain -cough -difficulty breathing -dizziness, drowsiness -fever or infection -joint aches or pains -pale or blue-tinted skin -redness, blistering, peeling or loosening of the skin, including inside the mouth -tingling, burning, pain, or numbness in hands or feet -unusual bleeding or bruising -unusually weak or tired -yellowing of eyes or skin Side effects that usually do not require medical attention (report to your doctor or health care professional if they continue or are bothersome): -dark urine -diarrhea -headache -loss of appetite -nausea or vomiting -temporary hair loss This list may not describe all possible side effects. Call your doctor for medical advice about side effects. You may report side effects to FDA at 1-800-FDA-1088. Where should I keep my medicine? Keep out of the reach of children. Store at room temperature between 15 and 30 degrees C (59 and 86 degrees F). Protect from light. Throw away any unused medicine after the expiration date. NOTE: This sheet is a summary. It may not cover all possible information. If you have questions about this medicine, talk to your doctor, pharmacist, or health care provider.    2016, Elsevier/Gold Standard. (2008-03-22 15:56:47)  Phenazopyridine tablets What is this medicine? PHENAZOPYRIDINE (fen az oh PEER i deen) is a pain reliever. It is used to stop  the pain, burning, or discomfort caused by infection or irritation of the urinary tract. This medicine is not an antibiotic. It will not cure a urinary tract infection. This medicine may be used for other purposes; ask your health care provider or pharmacist if you have questions. What should I tell my health care provider before I take this medicine? They need to know if you have any of these conditions: -glucose-6-phosphate dehydrogenase (G6PD) deficiency -kidney disease -an  unusual or allergic reaction to phenazopyridine, other medicines, foods, dyes, or preservatives -pregnant or trying to get pregnant -breast-feeding How should I use this medicine? Take this medicine by mouth with a glass of water. Follow the directions on the prescription label. Take after meals. Take your doses at regular intervals. Do not take your medicine more often than directed. Do not skip doses or stop your medicine early even if you feel better. Do not stop taking except on your doctor's advice. Talk to your pediatrician regarding the use of this medicine in children. Special care may be needed. Overdosage: If you think you have taken too much of this medicine contact a poison control center or emergency room at once. NOTE: This medicine is only for you. Do not share this medicine with others. What if I miss a dose? If you miss a dose, take it as soon as you can. If it is almost time for your next dose, take only that dose. Do not take double or extra doses. What may interact with this medicine? Interactions are not expected. This list may not describe all possible interactions. Give your health care provider a list of all the medicines, herbs, non-prescription drugs, or dietary supplements you use. Also tell them if you smoke, drink alcohol, or use illegal drugs. Some items may interact with your medicine. What should I watch for while using this medicine? Tell your doctor or health care professional if your symptoms do not improve or if they get worse. This medicine colors body fluids red. This effect is harmless and will go away after you are done taking the medicine. It will change urine to an dark orange or red color. The red color may stain clothing. Soft contact lenses may become permanently stained. It is best not to wear soft contact lenses while taking this medicine. If you are diabetic you may get a false positive result for sugar in your urine. Talk to your health care  provider. What side effects may I notice from receiving this medicine? Side effects that you should report to your doctor or health care professional as soon as possible: -allergic reactions like skin rash, itching or hives, swelling of the face, lips, or tongue -blue or purple color of the skin -difficulty breathing -fever -less urine -unusual bleeding, bruising -unusual tired, weak -vomiting -yellowing of the eyes or skin Side effects that usually do not require medical attention (report to your doctor or health care professional if they continue or are bothersome): -dark urine -headache -stomach upset This list may not describe all possible side effects. Call your doctor for medical advice about side effects. You may report side effects to FDA at 1-800-FDA-1088. Where should I keep my medicine? Keep out of the reach of children. Store at room temperature between 15 and 30 degrees C (59 and 86 degrees F). Protect from light and moisture. Throw away any unused medicine after the expiration date. NOTE: This sheet is a summary. It may not cover all possible information. If you have questions about this  medicine, talk to your doctor, pharmacist, or health care provider.    2016, Elsevier/Gold Standard. (2008-03-30 11:04:07)

## 2016-09-30 ENCOUNTER — Telehealth: Payer: Self-pay

## 2016-09-30 NOTE — Telephone Encounter (Signed)
PT DROPPED OFF 2 FORMS TO BE COMPLETED FOR HER TB PLACEMENT, PLEASE CALL PT TO PICK UP WHEN READY    BEST PHONE FOR PT IS [phone]

## 2016-10-01 NOTE — Telephone Encounter (Signed)
Where was form placed

## 2016-10-03 NOTE — Telephone Encounter (Signed)
Form completed, signed, pt called to pick up  - placed up front

## 2017-09-21 ENCOUNTER — Other Ambulatory Visit: Payer: Self-pay

## 2017-09-21 ENCOUNTER — Ambulatory Visit (INDEPENDENT_AMBULATORY_CARE_PROVIDER_SITE_OTHER): Payer: 59 | Admitting: Family Medicine

## 2017-09-21 ENCOUNTER — Encounter: Payer: Self-pay | Admitting: Family Medicine

## 2017-09-21 VITALS — BP 111/68 | HR 88 | Temp 98.9°F | Resp 16 | Ht 66.5 in | Wt 135.0 lb

## 2017-09-21 DIAGNOSIS — Z113 Encounter for screening for infections with a predominantly sexual mode of transmission: Secondary | ICD-10-CM

## 2017-09-21 DIAGNOSIS — Z130 Encounter for screening for diseases of the blood and blood-forming organs and certain disorders involving the immune mechanism: Secondary | ICD-10-CM | POA: Diagnosis not present

## 2017-09-21 DIAGNOSIS — J4520 Mild intermittent asthma, uncomplicated: Secondary | ICD-10-CM

## 2017-09-21 DIAGNOSIS — Z0000 Encounter for general adult medical examination without abnormal findings: Secondary | ICD-10-CM | POA: Diagnosis not present

## 2017-09-21 DIAGNOSIS — Z111 Encounter for screening for respiratory tuberculosis: Secondary | ICD-10-CM

## 2017-09-21 DIAGNOSIS — Z13220 Encounter for screening for lipoid disorders: Secondary | ICD-10-CM | POA: Diagnosis not present

## 2017-09-21 LAB — CBC
HEMATOCRIT: 39.8 % (ref 34.0–46.6)
HEMOGLOBIN: 13.4 g/dL (ref 11.1–15.9)
MCH: 29.9 pg (ref 26.6–33.0)
MCHC: 33.7 g/dL (ref 31.5–35.7)
MCV: 89 fL (ref 79–97)
Platelets: 373 10*3/uL (ref 150–379)
RBC: 4.48 x10E6/uL (ref 3.77–5.28)
RDW: 12.9 % (ref 12.3–15.4)
WBC: 5.5 10*3/uL (ref 3.4–10.8)

## 2017-09-21 LAB — LIPID PANEL
CHOL/HDL RATIO: 2.5 ratio (ref 0.0–4.4)
Cholesterol, Total: 147 mg/dL (ref 100–199)
HDL: 58 mg/dL (ref >39)
LDL CALC: 79 mg/dL (ref 0–99)
TRIGLYCERIDES: 49 mg/dL (ref 0–149)
VLDL Cholesterol Cal: 10 mg/dL (ref 5–40)

## 2017-09-21 NOTE — Patient Instructions (Addendum)
     IF you received an x-ray today, you will receive an invoice from [facility]. Please contact [facility] at [phone] with questions or concerns regarding your invoice.   IF you received labwork today, you will receive an invoice from [facility]. Please contact [facility] at [phone] with questions or concerns regarding your invoice.   Our billing staff will not be able to assist you with questions regarding bills from these companies.  You will be contacted with the lab results as soon as they are available. The fastest way to get your results is to activate your My Chart account. Instructions are located on the last page of this paperwork. If you have not heard from [place] regarding the results in 2 weeks, please contact this office.     Vitamin D Deficiency Vitamin D deficiency is when your body does not have enough vitamin D. Vitamin D is important because:  It helps your body use other minerals that your body needs.  It helps keep your bones strong and healthy.  It may help to prevent some diseases.  It helps your heart and other muscles work well.  You can get vitamin D by:  Eating foods with vitamin D in them.  Drinking or eating milk or other foods that have had vitamin D added to them.  Taking a vitamin D supplement.  Being in the sun.  Not getting enough vitamin D can make your bones become soft. It can also cause other health problems. Follow these instructions at home:  Take medicines and supplements only as told by your doctor.  Eat foods that have vitamin D. These include: ? Dairy products, cereals, or juices with added vitamin D. Check the label for vitamin D. ? Fatty fish like salmon or trout. ? Eggs. ? Oysters.  Do not use tanning beds.  Stay at a healthy weight. Lose weight, if needed.  Keep all follow-up visits as told by your doctor. This is important. Contact a doctor if:  Your symptoms do not go away.  You feel sick to  your stomach (nauseous).  Youthrow up (vomit).  You poop less often than usual or you have trouble pooping (constipation). This information is not intended to replace advice given to you by your health care provider. Make sure you discuss any questions you have with your health care provider. Document Released: 08/21/2011 Document Revised: 02/07/2016 Document Reviewed: 01/17/2015 Elsevier Interactive Patient Education  [employer].

## 2017-09-21 NOTE — Progress Notes (Signed)
Chief Complaint  Patient presents with  . Annual Exam    with pap and tb test    Subjective:  Leah [name] is a 27 y.o. female here for a health maintenance visit.  Patient is new pt  Patient Active Problem List   Diagnosis Date Noted  . Dermoid cyst of left ovary 11/23/2013    Past Medical History:  Diagnosis Date  . Asthma   . Herpes genitalia   . Ovarian cyst     Past Surgical History:  Procedure Laterality Date  . EYE MUSCLE SURGERY    . OVARIAN CYST REMOVAL       Outpatient Medications Prior to Visit  Medication Sig Dispense Refill  . albuterol (PROVENTIL HFA;VENTOLIN HFA) 108 (90 Base) MCG/ACT inhaler Inhale 2 puffs into the lungs every 6 (six) hours as needed for wheezing or shortness of breath.    . etonogestrel (NEXPLANON) 68 MG IMPL implant 1 each by Subdermal route once.    . valACYclovir (VALTREX) 500 MG tablet Take 500 mg by mouth daily.    Marland Kitchen etonogestrel-ethinyl estradiol (NUVARING) 0.12-0.015 MG/24HR vaginal ring Place 1 each vaginally every 28 (twenty-eight) days. Insert vaginally and leave in place for 3 consecutive weeks, then remove for 1 week.    . nitrofurantoin, macrocrystal-monohydrate, (MACROBID) 100 MG capsule Take 1 capsule (100 mg total) by mouth 2 (two) times daily. (Patient not taking: Reported on 09/21/2017) 10 capsule 0  . phenazopyridine (PYRIDIUM) 200 MG tablet Take 1 tablet (200 mg total) by mouth 3 (three) times daily. (Patient not taking: Reported on 09/21/2017) 6 tablet 0   No facility-administered medications prior to visit.     Allergies  Allergen Reactions  . Penicillins Other (See Comments)    Family allergy  Has patient had a PCN reaction causing immediate rash, facial/tongue/throat swelling, SOB or lightheadedness with hypotension: No Has patient had a PCN reaction causing severe rash involving mucus membranes or skin necrosis: No Has patient had a PCN reaction that required hospitalization No Has patient had a PCN reaction  occurring within the last 10 years: No If all of the above answers are "NO", then may proceed with Cephalosporin use.     Family History  Problem Relation Age of Onset  . Asthma Father   . Hyperlipidemia Father   . Asthma Brother   . Cancer Mother        lung  . Asthma Sister   . Asthma Paternal Aunt   . Asthma Paternal Grandmother      Health Habits: Dental Exam: up to date Eye Exam: up to date Exercise: 0 times/week on average Current exercise activities: walking/running Diet: balanced  Social History   Socioeconomic History  . Marital status: Single    Spouse name: Not on file  . Number of children: Not on file  . Years of education: Not on file  . Highest education level: Not on file  Social Needs  . Financial resource strain: Not on file  . Food insecurity - worry: Not on file  . Food insecurity - inability: Not on file  . Transportation needs - medical: Not on file  . Transportation needs - non-medical: Not on file  Occupational History  . Not on file  Tobacco Use  . Smoking status: Never Smoker  . Smokeless tobacco: Never Used  Substance and Sexual Activity  . Alcohol use: No    Alcohol/week: 0.0 oz    Comment: ETOh once a week, no binge drinker  . Drug use:  No  . Sexual activity: Yes    Partners: Female, Female    Birth control/protection: Implant  Other Topics Concern  . Not on file  Social History Narrative   Single   Current student at [employer]   Substance and Sexual Activity  Alcohol Use No  . Alcohol/week: 0.0 oz   Comment: ETOh once a week, no binge drinker   Social History   Tobacco Use  Smoking Status Never Smoker  Smokeless Tobacco Never Used   Social History   Substance and Sexual Activity  Drug Use No    GYN: Sexual Health Menstrual status: regular menses LMP: Patient's last menstrual period was 07/22/2017. Last pap smear: see HM section History of abnormal pap smears:  Sexually active:  with female  partner Current contraception: nexplanon  Health Maintenance: See under health Maintenance activity for review of completion dates as well. There is no immunization history for the selected administration types on file for this patient.    Depression Screen-PHQ2/9 Depression screen [facility] 2/9 09/21/2017 11/23/2015  Decreased Interest 0 0  Down, Depressed, Hopeless 0 0  PHQ - 2 Score 0 0       Depression Severity and Treatment Recommendations:  0-4= None  5-9= Mild / Treatment: Support, educate to call if worse; return in one month  10-14= Moderate / Treatment: Support, watchful waiting; Antidepressant or Psycotherapy  15-19= Moderately severe / Treatment: Antidepressant OR Psychotherapy  >= 20 = Major depression, severe / Antidepressant AND Psychotherapy    Review of Systems   Review of Systems  Constitutional: Negative for chills and fever.  HENT: Negative for congestion, hearing loss and nosebleeds.   Respiratory: Negative for cough, sputum production, shortness of breath and wheezing.   Cardiovascular: Negative for chest pain, palpitations and orthopnea.  Gastrointestinal: Negative for abdominal pain, nausea and vomiting.  Genitourinary: Negative for dysuria, frequency and urgency.  Musculoskeletal: Negative for myalgias and neck pain.  Skin: Negative for itching and rash.  Neurological: Negative for dizziness and headaches.  Psychiatric/Behavioral: Negative for depression and hallucinations. The patient is not nervous/anxious.     See HPI for ROS as well.    Objective:   Vitals:   09/21/17 0949  BP: 111/68  Pulse: 88  Resp: 16  Temp: 98.9 F (37.2 C)  TempSrc: Oral  SpO2: 100%  Weight: 135 lb (61.2 kg)  Height: 5' 6.5" (1.689 m)    Body mass index is 21.46 kg/m.  Physical Exam  Constitutional: She is oriented to person, place, and time. She appears well-developed and well-nourished.  HENT:  Head: Normocephalic and atraumatic.  Right Ear: External ear  normal.  Left Ear: External ear normal.  Nose: Nose normal.  Mouth/Throat: Oropharynx is clear and moist.  Neck: Normal range of motion. No thyromegaly present.  Cardiovascular: Normal rate, regular rhythm and normal heart sounds.  No murmur heard. Pulmonary/Chest: Effort normal and breath sounds normal. No respiratory distress. She has no wheezes. She has no rales.  Abdominal: Soft. Bowel sounds are normal. She exhibits no distension. There is no tenderness. There is no rebound.  Musculoskeletal: Normal range of motion. She exhibits no edema.  Neurological: She is alert and oriented to person, place, and time. She has normal reflexes.  Skin: Skin is warm. No erythema.  Psychiatric: She has a normal mood and affect. Her behavior is normal. Judgment and thought content normal.   Pap smear deferred Managed by Gynecology    Assessment/Plan:   Patient was seen  for a health maintenance exam.  Counseled the patient on health maintenance issues. Reviewed her health mainteance schedule and ordered appropriate tests (see orders.) Counseled on regular exercise and weight management. Recommend regular eye exams and dental cleaning.   The following issues were addressed today for health maintenance:   Makinzi was seen today for annual exam.  Diagnoses and all orders for this visit:  Health maintenance examination- discussed age appropriate screenings  Screening-pulmonary TB -     TB Skin Test  Screen for STD (sexually transmitted disease)- urine std  Asthma, mild intermittent, well-controlled- albuterol prn  Screenings today include cbc for anemia and lipid panel     Return if symptoms worsen or fail to improve.    Body mass index is 21.46 kg/m.:  Discussed the patient's BMI with patient. The BMI body mass index is 21.46 kg/m.     No future appointments.  Patient Instructions       IF you received an x-ray today, you will receive an invoice from [facility].  Please contact [facility] at [phone] with questions or concerns regarding your invoice.   IF you received labwork today, you will receive an invoice from [facility]. Please contact [facility] at [phone] with questions or concerns regarding your invoice.   Our billing staff will not be able to assist you with questions regarding bills from these companies.  You will be contacted with the lab results as soon as they are available. The fastest way to get your results is to activate your My Chart account. Instructions are located on the last page of this paperwork. If you have not heard from [place] regarding the results in 2 weeks, please contact this office.

## 2017-09-22 LAB — GC/CHLAMYDIA PROBE AMP
Chlamydia trachomatis, NAA: NEGATIVE
Neisseria gonorrhoeae by PCR: NEGATIVE

## 2017-09-23 ENCOUNTER — Ambulatory Visit: Payer: 59 | Admitting: Physician Assistant

## 2017-09-23 DIAGNOSIS — Z111 Encounter for screening for respiratory tuberculosis: Secondary | ICD-10-CM

## 2017-09-23 LAB — TB SKIN TEST
Induration: 0 mm
TB Skin Test: NEGATIVE

## 2017-09-23 NOTE — Progress Notes (Signed)
Close Encounter 

## 2017-10-31 ENCOUNTER — Ambulatory Visit (HOSPITAL_COMMUNITY)
Admission: EM | Admit: 2017-10-31 | Discharge: 2017-10-31 | Disposition: A | Discharge status: Home / Self Care | Payer: 59 | Attending: Family Medicine | Admitting: Family Medicine

## 2017-10-31 ENCOUNTER — Encounter (HOSPITAL_COMMUNITY): Payer: Self-pay | Admitting: Family Medicine

## 2017-10-31 ENCOUNTER — Telehealth (HOSPITAL_COMMUNITY): Payer: Self-pay | Admitting: Emergency Medicine

## 2017-10-31 DIAGNOSIS — J111 Influenza due to unidentified influenza virus with other respiratory manifestations: Secondary | ICD-10-CM

## 2017-10-31 DIAGNOSIS — R69 Illness, unspecified: Secondary | ICD-10-CM

## 2017-10-31 MED ORDER — OSELTAMIVIR PHOSPHATE 75 MG PO CAPS: 75.0000 mg | ORAL_CAPSULE | Freq: Two times a day (BID) | ORAL | 0 refills | Status: DC

## 2017-10-31 MED ORDER — ACETAMINOPHEN 325 MG PO TABS
ORAL_TABLET | ORAL | Status: AC
  Filled 2017-10-31: qty 2

## 2017-10-31 MED ORDER — ACETAMINOPHEN 325 MG PO TABS
650.0000 mg | ORAL_TABLET | Freq: Once | ORAL | Status: AC
  Administered 2017-10-31: 650 mg via ORAL

## 2017-10-31 MED ORDER — IBUPROFEN 800 MG PO TABS
800.0000 mg | ORAL_TABLET | Freq: Once | ORAL | Status: AC
  Administered 2017-10-31: 800 mg via ORAL

## 2017-10-31 MED ORDER — IBUPROFEN 800 MG PO TABS
ORAL_TABLET | ORAL | Status: AC
  Filled 2017-10-31: qty 1

## 2017-10-31 NOTE — ED Triage Notes (Signed)
Pt here for flu like symptoms since yesterday. sts that she took aleve and tylenol.

## 2017-10-31 NOTE — Telephone Encounter (Signed)
[facility] [address] [facility]  Wants to know if [facility] can [facility] [address].   Rx resent.

## 2017-10-31 NOTE — ED Provider Notes (Signed)
[place]   [id number] 10/31/17 Arrival Time: 1844  ASSESSMENT & PLAN:  1. Influenza-like illness     Meds ordered this encounter  Medications  . acetaminophen (TYLENOL) tablet 650 mg  . ibuprofen (ADVIL,MOTRIN) tablet 800 mg  . DISCONTD: oseltamivir (TAMIFLU) 75 MG capsule    Sig: Take 1 capsule (75 mg total) by mouth every 12 (twelve) hours.    Dispense:  10 capsule    Refill:  0    Order Specific Question:   Supervising Provider    Answer:   Vanessa Kick [[id number]]    Reviewed expectations re: course of current medical issues. Questions answered. Outlined signs and symptoms indicating need for more acute intervention. Patient verbalized understanding. After Visit Summary given.   SUBJECTIVE: History from: patient. Leah [name] is a 27 y.o. female who presents with complaint of persistent flu like sx's. Reports abrupt onset today. Described symptoms have gradually worsened since beginning.  ROS: As per HPI.   OBJECTIVE:  Vitals:   10/31/17 1922  BP: 118/75  Pulse: (!) 127  Resp: 18  Temp: 100.3 F (37.9 C)  SpO2: 96%    General appearance: alert; no distress Eyes: PERRLA; EOMI; conjunctiva normal HENT: normocephalic; atraumatic; TMs normal; nasal mucosa normal; oral mucosa normal Neck: supple  Lungs: clear to auscultation bilaterally Heart: regular rate and rhythm Abdomen: soft, non-tender; bowel sounds normal; no masses or organomegaly; no guarding or rebound tenderness Back: no CVA tenderness Extremities: no cyanosis or edema; symmetrical with no gross deformities Skin: warm and dry Neurologic: normal gait; normal symmetric reflexes Psychological: alert and cooperative; normal mood and affect  Labs:  Labs Reviewed - No data to display  Imaging: No results found.  Allergies  Allergen Reactions  . Penicillins Other (See Comments)    Family allergy  Has patient had a PCN reaction causing immediate rash, facial/tongue/throat  swelling, SOB or lightheadedness with hypotension: No Has patient had a PCN reaction causing severe rash involving mucus membranes or skin necrosis: No Has patient had a PCN reaction that required hospitalization No Has patient had a PCN reaction occurring within the last 10 years: No If all of the above answers are "NO", then may proceed with Cephalosporin use.    Past Medical History:  Diagnosis Date  . Asthma   . Herpes genitalia   . Ovarian cyst    Social History   Socioeconomic History  . Marital status: Single    Spouse name: Not on file  . Number of children: Not on file  . Years of education: Not on file  . Highest education level: Not on file  Social Needs  . Financial resource strain: Not on file  . Food insecurity - worry: Not on file  . Food insecurity - inability: Not on file  . Transportation needs - medical: Not on file  . Transportation needs - non-medical: Not on file  Occupational History  . Not on file  Tobacco Use  . Smoking status: Never Smoker  . Smokeless tobacco: Never Used  Substance and Sexual Activity  . Alcohol use: No    Alcohol/week: 0.0 oz    Comment: ETOh once a week, no binge drinker  . Drug use: No  . Sexual activity: Yes    Partners: Female, Female    Birth control/protection: Implant  Other Topics Concern  . Not on file  Social History Narrative   Single   Current student at [employer]   Family History  Problem Relation Age of Onset  .  Asthma Father   . Hyperlipidemia Father   . Asthma Brother   . Cancer Mother        lung  . Asthma Sister   . Asthma Paternal Aunt   . Asthma Paternal Grandmother    Past Surgical History:  Procedure Laterality Date  . EYE MUSCLE SURGERY    . OVARIAN CYST REMOVAL       Lysbeth Penner, [place] 10/31/17 2055

## 2017-11-02 ENCOUNTER — Telehealth (HOSPITAL_COMMUNITY): Payer: Self-pay | Admitting: Emergency Medicine

## 2017-11-02 NOTE — Telephone Encounter (Signed)
Notified patient that school note was at front desk

## 2017-11-02 NOTE — Telephone Encounter (Signed)
Mother called requesting an extension on a work note.  Requested that mother have patient called this nurse.    Spoke to dr Joseph Art, extend original work note by 2 days.  Prepared a note saying this.  Waiting for patient to call to notify.

## 2017-11-23 ENCOUNTER — Telehealth: Payer: Self-pay | Admitting: Family Medicine

## 2017-11-23 NOTE — Telephone Encounter (Signed)
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 I gave her a copy of her form and have placed the form to be completed in Dr. Nolon Rod box.   When completed, please call pt at [phone] to be picked up.  Thanks!

## 2018-03-17 NOTE — Telephone Encounter (Signed)
Formed completed. Dgaddy, CMA

## 2018-06-14 ENCOUNTER — Ambulatory Visit: Payer: 59 | Admitting: Family Medicine

## 2018-11-05 ENCOUNTER — Ambulatory Visit (INDEPENDENT_AMBULATORY_CARE_PROVIDER_SITE_OTHER): Payer: 59 | Admitting: Family Medicine

## 2018-11-05 ENCOUNTER — Encounter: Payer: Self-pay | Admitting: Family Medicine

## 2018-11-05 ENCOUNTER — Encounter: Payer: 59 | Admitting: Family Medicine

## 2018-11-05 ENCOUNTER — Other Ambulatory Visit: Payer: Self-pay

## 2018-11-05 VITALS — BP 117/74 | HR 79 | Temp 99.0°F | Resp 16 | Ht 65.5 in | Wt 155.2 lb

## 2018-11-05 DIAGNOSIS — Z0000 Encounter for general adult medical examination without abnormal findings: Secondary | ICD-10-CM

## 2018-11-05 DIAGNOSIS — F419 Anxiety disorder, unspecified: Secondary | ICD-10-CM

## 2018-11-05 DIAGNOSIS — F329 Major depressive disorder, single episode, unspecified: Secondary | ICD-10-CM

## 2018-11-05 DIAGNOSIS — Z0001 Encounter for general adult medical examination with abnormal findings: Secondary | ICD-10-CM

## 2018-11-05 DIAGNOSIS — Z113 Encounter for screening for infections with a predominantly sexual mode of transmission: Secondary | ICD-10-CM | POA: Diagnosis not present

## 2018-11-05 MED ORDER — ESCITALOPRAM OXALATE 10 MG PO TABS: 10.0000 mg | ORAL_TABLET | Freq: Every day | ORAL | 3 refills | Status: DC

## 2018-11-05 MED ORDER — ESCITALOPRAM OXALATE 5 MG PO TABS: ORAL_TABLET | ORAL | 0 refills | Status: DC

## 2018-11-05 NOTE — Progress Notes (Signed)
Chief Complaint  Patient presents with  . Annual Exam    no pap, per pt "not due until June 2020, last pap smear last year was normal"  . "spot on my mid back x 2 years,has become sensitive"  . Depression    score 14 PHQ9    Subjective:  Leah [name] is a 28 y.o. female here for a health maintenance visit.  Patient is established pt  She reports that her depression comes and goes It is now depression not just associated with school and is now an every day thing She has never been on medication for depression She had counseling at [employer] but that did not help Depression screen [facility] 2/9 11/05/2018 11/05/2018 09/21/2017 11/23/2015  Decreased Interest 3 0 0 0  Down, Depressed, Hopeless 2 0 0 0  PHQ - 2 Score 5 0 0 0  Altered sleeping 1 - - -  Tired, decreased energy 2 - - -  Change in appetite 1 - - -  Feeling bad or failure about yourself  3 - - -  Trouble concentrating 2 - - -  Moving slowly or fidgety/restless 0 - - -  Suicidal thoughts 0 - - -  PHQ-9 Score 14 - - -  Difficult doing work/chores Somewhat difficult - - -     Patient Active Problem List   Diagnosis Date Noted  . Dermoid cyst of left ovary 11/23/2013    Past Medical History:  Diagnosis Date  . Asthma   . Herpes genitalia   . Ovarian cyst     Past Surgical History:  Procedure Laterality Date  . EYE MUSCLE SURGERY    . OVARIAN CYST REMOVAL       Outpatient Medications Prior to Visit  Medication Sig Dispense Refill  . albuterol (PROVENTIL HFA;VENTOLIN HFA) 108 (90 Base) MCG/ACT inhaler Inhale 2 puffs into the lungs every 6 (six) hours as needed for wheezing or shortness of breath.    . etonogestrel (NEXPLANON) 68 MG IMPL implant 1 each by Subdermal route once.    . valACYclovir (VALTREX) 500 MG tablet Take 500 mg by mouth daily.    Marland Kitchen etonogestrel-ethinyl estradiol (NUVARING) 0.12-0.015 MG/24HR vaginal ring Place 1 each vaginally every 28 (twenty-eight) days. Insert vaginally and leave in place for 3  consecutive weeks, then remove for 1 week.    . nitrofurantoin, macrocrystal-monohydrate, (MACROBID) 100 MG capsule Take 1 capsule (100 mg total) by mouth 2 (two) times daily. (Patient not taking: Reported on 09/21/2017) 10 capsule 0  . oseltamivir (TAMIFLU) 75 MG capsule Take 1 capsule (75 mg total) by mouth every 12 (twelve) hours. 10 capsule 0  . phenazopyridine (PYRIDIUM) 200 MG tablet Take 1 tablet (200 mg total) by mouth 3 (three) times daily. (Patient not taking: Reported on 09/21/2017) 6 tablet 0   No facility-administered medications prior to visit.     Allergies  Allergen Reactions  . Penicillins Other (See Comments)    Family allergy  Has patient had a PCN reaction causing immediate rash, facial/tongue/throat swelling, SOB or lightheadedness with hypotension: No Has patient had a PCN reaction causing severe rash involving mucus membranes or skin necrosis: No Has patient had a PCN reaction that required hospitalization No Has patient had a PCN reaction occurring within the last 10 years: No If all of the above answers are "NO", then may proceed with Cephalosporin use.     Family History  Problem Relation Age of Onset  . Asthma Father   . Hyperlipidemia Father   .  Asthma Brother   . Cancer Mother        lung  . Asthma Sister   . Asthma Paternal Aunt   . Asthma Paternal Grandmother      Health Habits: Dental Exam: up to date Eye Exam: up to date Exercise: 4 times/week on average Current exercise activities: walking/running Diet:   Social History   Socioeconomic History  . Marital status: Single    Spouse name: Not on file  . Number of children: Not on file  . Years of education: Not on file  . Highest education level: Not on file  Occupational History  . Not on file  Social Needs  . Financial resource strain: Not on file  . Food insecurity:    Worry: Not on file    Inability: Not on file  . Transportation needs:    Medical: Not on file    Non-medical: Not  on file  Tobacco Use  . Smoking status: Never Smoker  . Smokeless tobacco: Never Used  Substance and Sexual Activity  . Alcohol use: No    Alcohol/week: 0.0 standard drinks    Comment: ETOh once a week, no binge drinker  . Drug use: No  . Sexual activity: Yes    Partners: Female, Female    Birth control/protection: Implant  Lifestyle  . Physical activity:    Days per week: Not on file    Minutes per session: Not on file  . Stress: Not on file  Relationships  . Social connections:    Talks on phone: Not on file    Gets together: Not on file    Attends religious service: Not on file    Active member of club or organization: Not on file    Attends meetings of clubs or organizations: Not on file    Relationship status: Not on file  . Intimate partner violence:    Fear of current or ex partner: Not on file    Emotionally abused: Not on file    Physically abused: Not on file    Forced sexual activity: Not on file  Other Topics Concern  . Not on file  Social History Narrative   Single   Current student at [employer]   Social History   Substance and Sexual Activity  Alcohol Use No  . Alcohol/week: 0.0 standard drinks   Comment: ETOh once a week, no binge drinker   Social History   Tobacco Use  Smoking Status Never Smoker  Smokeless Tobacco Never Used   Social History   Substance and Sexual Activity  Drug Use No    GYN: Sexual Health Menstrual status: regular menses LMP: Patient's last menstrual period was 06/16/2018. Last pap smear: see HM section History of abnormal pap smears:  Sexually active:  With female partner Current contraception:   Health Maintenance: See under health Maintenance activity for review of completion dates as well. Immunization History  Administered Date(s) Administered  . PPD Test 09/21/2017  . Tdap 09/15/2014      Depression Screen-PHQ2/9 Depression screen [facility] 2/9 11/05/2018 11/05/2018 09/21/2017 11/23/2015  Decreased Interest 3 0 0 0    Down, Depressed, Hopeless 2 0 0 0  PHQ - 2 Score 5 0 0 0  Altered sleeping 1 - - -  Tired, decreased energy 2 - - -  Change in appetite 1 - - -  Feeling bad or failure about yourself  3 - - -  Trouble concentrating 2 - - -  Moving slowly or fidgety/restless  0 - - -  Suicidal thoughts 0 - - -  PHQ-9 Score 14 - - -  Difficult doing work/chores Somewhat difficult - - -       Depression Severity and Treatment Recommendations:  0-4= None  5-9= Mild / Treatment: Support, educate to call if worse; return in one month  10-14= Moderate / Treatment: Support, watchful waiting; Antidepressant or Psycotherapy  15-19= Moderately severe / Treatment: Antidepressant OR Psychotherapy  >= 20 = Major depression, severe / Antidepressant AND Psychotherapy    Review of Systems   ROS  See HPI for ROS as well.   Review of Systems  Constitutional: Negative for activity change, appetite change, chills and fever.  HENT: Negative for congestion, nosebleeds, trouble swallowing and voice change.   Respiratory: Negative for cough, shortness of breath and wheezing.   Gastrointestinal: Negative for diarrhea, nausea and vomiting.  Genitourinary: Negative for difficulty urinating, dysuria, flank pain and hematuria.  Musculoskeletal: Negative for back pain, joint swelling and neck pain.  Neurological: Negative for dizziness, speech difficulty, light-headedness and numbness.  See HPI. All other review of systems negative.   Objective:   Vitals:   11/05/18 1031  BP: 117/74  Pulse: 79  Resp: 16  Temp: 99 F (37.2 C)  TempSrc: Oral  SpO2: 100%  Weight: 155 lb 3.2 oz (70.4 kg)  Height: 5' 5.5" (1.664 m)    Body mass index is 25.43 kg/m.  Physical Exam  BP 117/74 (BP Location: Right Arm, Patient Position: Sitting, Cuff Size: Normal)   Pulse 79   Temp 99 F (37.2 C) (Oral)   Resp 16   Ht 5' 5.5" (1.664 m)   Wt 155 lb 3.2 oz (70.4 kg)   LMP 06/16/2018   SpO2 100%   BMI 25.43 kg/m    General Appearance:    Alert, cooperative, no distress, appears stated age  Head:    Normocephalic, without obvious abnormality, atraumatic  Eyes:    PERRL, conjunctiva/corneas clear, EOM's intact, fundi    benign, both eyes  Ears:    Normal TM's and external ear canals, both ears  Nose:   Nares normal, septum midline, mucosa normal, no drainage    or sinus tenderness  Throat:   Lips, mucosa, and tongue normal; teeth and gums normal  Neck:   Supple, symmetrical, trachea midline, no adenopathy;    thyroid:  no enlargement/tenderness/nodules; no carotid   bruit or JVD  Back:     Symmetric, no curvature, ROM normal, no CVA tenderness  Lungs:     Clear to auscultation bilaterally, respirations unlabored  Chest Wall:    No tenderness or deformity   Heart:    Regular rate and rhythm, S1 and S2 normal, no murmur, rub   or gallop  Breast Exam:    No tenderness, masses, or nipple abnormality  Abdomen:     Soft, non-tender, bowel sounds active all four quadrants,    no masses, no organomegaly  Extremities:   Extremities normal, atraumatic, no cyanosis or edema  Pulses:   2+ and symmetric all extremities  Skin:   Skin color, texture, turgor normal, no rashes or lesions  Lymph nodes:   Cervical, supraclavicular, and axillary nodes normal  Neurologic:   CNII-XII intact, normal strength, sensation and reflexes    throughout      Assessment/Plan:   Patient was seen for a health maintenance exam.  Counseled the patient on health maintenance issues. Reviewed her health mainteance schedule and ordered appropriate tests (see orders.)  Counseled on regular exercise and weight management. Recommend regular eye exams and dental cleaning.   The following issues were addressed today for health maintenance:   Sarabi was seen today for annual exam, "spot on my mid back x 2 years,has become sensitive" and depression.  Diagnoses and all orders for this visit:  Encounter for health maintenance  examination in adult- Women's Health Maintenance Plan Advised monthly breast exam and annual mammogram Advised dental exam every six months Discussed stress management Discussed pap smear screening guidelines   Screening examination for STD (sexually transmitted disease) -     GC/Chlamydia Probe Amp -     Hepatitis B surface antigen -     HIV Antibody (routine testing w rflx) -     RPR  Anxiety and depression-  Discussed exercise, counseling and SSRIs Will start lexapro  -     CBC with Differential/Platelet -     TSH -     Ambulatory referral to Psychology -     escitalopram (LEXAPRO) 5 MG tablet; Take one tablet a day for a week then increase to 2 tablets daily by mouth  Other orders -     escitalopram (LEXAPRO) 10 MG tablet; Take 1 tablet (10 mg total) by mouth daily.    Return in about 6 weeks (around 12/17/2018) for anxiety follow up (please use same day if needed).    Body mass index is 25.43 kg/m.:  Discussed the patient's BMI with patient. The BMI body mass index is 25.43 kg/m.     Future Appointments  Date Time Provider Jeannette  12/16/2018  1:40 PM Forrest Moron, MD PCP-PCP [facility]    Patient Instructions       If you have lab work done today you will be contacted with your lab results within the next 2 weeks.  If you have not heard from [place] then please contact us. The fastest way to get your results is to register for My Chart.   IF you received an x-ray today, you will receive an invoice from [facility]. Please contact [facility] at [phone] with questions or concerns regarding your invoice.   IF you received labwork today, you will receive an invoice from [facility]. Please contact [facility] at [phone] with questions or concerns regarding your invoice.   Our billing staff will not be able to assist you with questions regarding bills from these companies.  You will be contacted with the lab results as soon as they are  available. The fastest way to get your results is to activate your My Chart account. Instructions are located on the last page of this paperwork. If you have not heard from [place] regarding the results in 2 weeks, please contact this office.     Living With Depression Everyone experiences occasional disappointment, sadness, and loss in their lives. When you are feeling down, blue, or sad for at least 2 weeks in a row, it may mean that you have depression. Depression can affect your thoughts and feelings, relationships, daily activities, and physical health. It is caused by changes in the way your brain functions. If you receive a diagnosis of depression, your health care provider will tell you which type of depression you have and what treatment options are available to you. If you are living with depression, there are ways to help you recover from it and also ways to prevent it from coming back. How to cope with lifestyle changes Coping with stress     Stress  is your body's reaction to life changes and events, both good and bad. Stressful situations may include:  Getting married.  The death of a spouse.  Losing a job.  Retiring.  Having a baby. Stress can last just a few hours or it can be ongoing. Stress can play a major role in depression, so it is important to learn both how to cope with stress and how to think about it differently. Talk with your health care provider or a counselor if you would like to learn more about stress reduction. He or she may suggest some stress reduction techniques, such as:  Music therapy. This can include creating music or listening to music. Choose music that you enjoy and that inspires you.  Mindfulness-based meditation. This kind of meditation can be done while sitting or walking. It involves being aware of your normal breaths, rather than trying to control your breathing.  Centering prayer. This is a kind of meditation that involves focusing on a  spiritual word or phrase. Choose a word, phrase, or sacred image that is meaningful to you and that brings you peace.  Deep breathing. To do this, expand your stomach and inhale slowly through your nose. Hold your breath for 3-5 seconds, then exhale slowly, allowing your stomach muscles to relax.  Muscle relaxation. This involves intentionally tensing muscles then relaxing them. Choose a stress reduction technique that fits your lifestyle and personality. Stress reduction techniques take time and practice to develop. Set aside 5-15 minutes a day to do them. Therapists can offer training in these techniques. The training may be covered by some insurance plans. Other things you can do to manage stress include:  Keeping a stress diary. This can help you learn what triggers your stress and ways to control your response.  Understanding what your limits are and saying no to requests or events that lead to a schedule that is too full.  Thinking about how you respond to certain situations. You may not be able to control everything, but you can control how you react.  Adding humor to your life by watching funny films or TV shows.  Making time for activities that help you relax and not feeling guilty about spending your time this way.  Medicines Your health care provider may suggest certain medicines if he or she feels that they will help improve your condition. Avoid using alcohol and other substances that may prevent your medicines from working properly (may interact). It is also important to:  Talk with your pharmacist or health care provider about all the medicines that you take, their possible side effects, and what medicines are safe to take together.  Make it your goal to take part in all treatment decisions (shared decision-making). This includes giving input on the side effects of medicines. It is best if shared decision-making with your health care provider is part of your total treatment  plan. If your health care provider prescribes a medicine, you may not notice the full benefits of it for 4-8 weeks. Most people who are treated for depression need to be on medicine for at least 6-12 months after they feel better. If you are taking medicines as part of your treatment, do not stop taking medicines without first talking to your health care provider. You may need to have the medicine slowly decreased (tapered) over time to decrease the risk of harmful side effects. Relationships Your health care provider may suggest family therapy along with individual therapy and drug therapy. While there  may not be family problems that are causing you to feel depressed, it is still important to make sure your family learns as much as they can about your mental health. Having your family's support can help make your treatment successful. How to recognize changes in your condition Everyone has a different response to treatment for depression. Recovery from major depression happens when you have not had signs of major depression for two months. This may mean that you will start to:  Have more interest in doing activities.  Feel less hopeless than you did 2 months ago.  Have more energy.  Overeat less often, or have better or improving appetite.  Have better concentration. Your health care provider will work with you to decide the next steps in your recovery. It is also important to recognize when your condition is getting worse. Watch for these signs:  Having fatigue or low energy.  Eating too much or too little.  Sleeping too much or too little.  Feeling restless, agitated, or hopeless.  Having trouble concentrating or making decisions.  Having unexplained physical complaints.  Feeling irritable, angry, or aggressive. Get help as soon as you or your family members notice these symptoms coming back. How to get support and help from others How to talk with friends and family members about  your condition  Talking to friends and family members about your condition can provide you with one way to get support and guidance. Reach out to trusted friends or family members, explain your symptoms to them, and let them know that you are working with a health care provider to treat your depression. Financial resources Not all insurance plans cover mental health care, so it is important to check with your insurance carrier. If paying for co-pays or counseling services is a problem, search for a local or county mental health care center. They may be able to offer public mental health care services at low or no cost when you are not able to see a private health care provider. If you are taking medicine for depression, you may be able to get the generic form, which may be less expensive. Some makers of prescription medicines also offer help to patients who cannot afford the medicines they need. Follow these instructions at home:   Get the right amount and quality of sleep.  Cut down on using caffeine, tobacco, alcohol, and other potentially harmful substances.  Try to exercise, such as walking or lifting small weights.  Take over-the-counter and prescription medicines only as told by your health care provider.  Eat a healthy diet that includes plenty of vegetables, fruits, whole grains, low-fat dairy products, and lean protein. Do not eat a lot of foods that are high in solid fats, added sugars, or salt.  Keep all follow-up visits as told by your health care provider. This is important. Contact a health care provider if:  You stop taking your antidepressant medicines, and you have any of these symptoms: ? Nausea. ? Headache. ? Feeling lightheaded. ? Chills and body aches. ? Not being able to sleep (insomnia).  You or your friends and family think your depression is getting worse. Get help right away if:  You have thoughts of hurting yourself or others. If you ever feel like you may  hurt yourself or others, or have thoughts about taking your own life, get help right away. You can go to your nearest emergency department or call:  Your local emergency services (911 in the [place]  A  suicide crisis helpline, such as the [place] at 8153701730. This is open 24-hours a day. Summary  If you are living with depression, there are ways to help you recover from it and also ways to prevent it from coming back.  Work with your health care team to create a management plan that includes counseling, stress management techniques, and healthy lifestyle habits. This information is not intended to replace advice given to you by your health care provider. Make sure you discuss any questions you have with your health care provider. Document Released: 08/04/2016 Document Revised: 08/04/2016 Document Reviewed: 08/04/2016 Elsevier Interactive Patient Education  2019 [employer].

## 2018-11-05 NOTE — Patient Instructions (Addendum)
If you have lab work done today you will be contacted with your lab results within the next 2 weeks.  If you have not heard from [place] then please contact us. The fastest way to get your results is to register for My Chart.   IF you received an x-ray today, you will receive an invoice from [facility]. Please contact [facility] at [phone] with questions or concerns regarding your invoice.   IF you received labwork today, you will receive an invoice from [facility]. Please contact [facility] at [phone] with questions or concerns regarding your invoice.   Our billing staff will not be able to assist you with questions regarding bills from these companies.  You will be contacted with the lab results as soon as they are available. The fastest way to get your results is to activate your My Chart account. Instructions are located on the last page of this paperwork. If you have not heard from [place] regarding the results in 2 weeks, please contact this office.     Living With Depression Everyone experiences occasional disappointment, sadness, and loss in their lives. When you are feeling down, blue, or sad for at least 2 weeks in a row, it may mean that you have depression. Depression can affect your thoughts and feelings, relationships, daily activities, and physical health. It is caused by changes in the way your brain functions. If you receive a diagnosis of depression, your health care provider will tell you which type of depression you have and what treatment options are available to you. If you are living with depression, there are ways to help you recover from it and also ways to prevent it from coming back. How to cope with lifestyle changes Coping with stress     Stress is your body's reaction to life changes and events, both good and bad. Stressful situations may include:  Getting married.  The death of a spouse.  Losing a job.  Retiring.  Having a  baby. Stress can last just a few hours or it can be ongoing. Stress can play a major role in depression, so it is important to learn both how to cope with stress and how to think about it differently. Talk with your health care provider or a counselor if you would like to learn more about stress reduction. He or she may suggest some stress reduction techniques, such as:  Music therapy. This can include creating music or listening to music. Choose music that you enjoy and that inspires you.  Mindfulness-based meditation. This kind of meditation can be done while sitting or walking. It involves being aware of your normal breaths, rather than trying to control your breathing.  Centering prayer. This is a kind of meditation that involves focusing on a spiritual word or phrase. Choose a word, phrase, or sacred image that is meaningful to you and that brings you peace.  Deep breathing. To do this, expand your stomach and inhale slowly through your nose. Hold your breath for 3-5 seconds, then exhale slowly, allowing your stomach muscles to relax.  Muscle relaxation. This involves intentionally tensing muscles then relaxing them. Choose a stress reduction technique that fits your lifestyle and personality. Stress reduction techniques take time and practice to develop. Set aside 5-15 minutes a day to do them. Therapists can offer training in these techniques. The training may be covered by some insurance plans. Other things you can do to manage stress include:  Keeping a stress diary. This  can help you learn what triggers your stress and ways to control your response.  Understanding what your limits are and saying no to requests or events that lead to a schedule that is too full.  Thinking about how you respond to certain situations. You may not be able to control everything, but you can control how you react.  Adding humor to your life by watching funny films or TV shows.  Making time for activities  that help you relax and not feeling guilty about spending your time this way.  Medicines Your health care provider may suggest certain medicines if he or she feels that they will help improve your condition. Avoid using alcohol and other substances that may prevent your medicines from working properly (may interact). It is also important to:  Talk with your pharmacist or health care provider about all the medicines that you take, their possible side effects, and what medicines are safe to take together.  Make it your goal to take part in all treatment decisions (shared decision-making). This includes giving input on the side effects of medicines. It is best if shared decision-making with your health care provider is part of your total treatment plan. If your health care provider prescribes a medicine, you may not notice the full benefits of it for 4-8 weeks. Most people who are treated for depression need to be on medicine for at least 6-12 months after they feel better. If you are taking medicines as part of your treatment, do not stop taking medicines without first talking to your health care provider. You may need to have the medicine slowly decreased (tapered) over time to decrease the risk of harmful side effects. Relationships Your health care provider may suggest family therapy along with individual therapy and drug therapy. While there may not be family problems that are causing you to feel depressed, it is still important to make sure your family learns as much as they can about your mental health. Having your family's support can help make your treatment successful. How to recognize changes in your condition Everyone has a different response to treatment for depression. Recovery from major depression happens when you have not had signs of major depression for two months. This may mean that you will start to:  Have more interest in doing activities.  Feel less hopeless than you did 2 months  ago.  Have more energy.  Overeat less often, or have better or improving appetite.  Have better concentration. Your health care provider will work with you to decide the next steps in your recovery. It is also important to recognize when your condition is getting worse. Watch for these signs:  Having fatigue or low energy.  Eating too much or too little.  Sleeping too much or too little.  Feeling restless, agitated, or hopeless.  Having trouble concentrating or making decisions.  Having unexplained physical complaints.  Feeling irritable, angry, or aggressive. Get help as soon as you or your family members notice these symptoms coming back. How to get support and help from others How to talk with friends and family members about your condition  Talking to friends and family members about your condition can provide you with one way to get support and guidance. Reach out to trusted friends or family members, explain your symptoms to them, and let them know that you are working with a health care provider to treat your depression. Financial resources Not all insurance plans cover mental health care, so it is  important to check with your insurance carrier. If paying for co-pays or counseling services is a problem, search for a local or county mental health care center. They may be able to offer public mental health care services at low or no cost when you are not able to see a private health care provider. If you are taking medicine for depression, you may be able to get the generic form, which may be less expensive. Some makers of prescription medicines also offer help to patients who cannot afford the medicines they need. Follow these instructions at home:   Get the right amount and quality of sleep.  Cut down on using caffeine, tobacco, alcohol, and other potentially harmful substances.  Try to exercise, such as walking or lifting small weights.  Take over-the-counter and  prescription medicines only as told by your health care provider.  Eat a healthy diet that includes plenty of vegetables, fruits, whole grains, low-fat dairy products, and lean protein. Do not eat a lot of foods that are high in solid fats, added sugars, or salt.  Keep all follow-up visits as told by your health care provider. This is important. Contact a health care provider if:  You stop taking your antidepressant medicines, and you have any of these symptoms: ? Nausea. ? Headache. ? Feeling lightheaded. ? Chills and body aches. ? Not being able to sleep (insomnia).  You or your friends and family think your depression is getting worse. Get help right away if:  You have thoughts of hurting yourself or others. If you ever feel like you may hurt yourself or others, or have thoughts about taking your own life, get help right away. You can go to your nearest emergency department or call:  Your local emergency services (911 in the [place]  A suicide crisis helpline, such as the [place] at 9125135287. This is open 24-hours a day. Summary  If you are living with depression, there are ways to help you recover from it and also ways to prevent it from coming back.  Work with your health care team to create a management plan that includes counseling, stress management techniques, and healthy lifestyle habits. This information is not intended to replace advice given to you by your health care provider. Make sure you discuss any questions you have with your health care provider. Document Released: 08/04/2016 Document Revised: 08/04/2016 Document Reviewed: 08/04/2016 Elsevier Interactive Patient Education  2019 [employer].

## 2018-11-06 LAB — CBC WITH DIFFERENTIAL/PLATELET
BASOS: 0 %
Basophils Absolute: 0 10*3/uL (ref 0.0–0.2)
EOS (ABSOLUTE): 0.1 10*3/uL (ref 0.0–0.4)
EOS: 2 %
HEMATOCRIT: 38.5 % (ref 34.0–46.6)
HEMOGLOBIN: 12.5 g/dL (ref 11.1–15.9)
IMMATURE GRANS (ABS): 0 10*3/uL (ref 0.0–0.1)
Immature Granulocytes: 0 %
LYMPHS: 37 %
Lymphocytes Absolute: 2.7 10*3/uL (ref 0.7–3.1)
MCH: 28.3 pg (ref 26.6–33.0)
MCHC: 32.5 g/dL (ref 31.5–35.7)
MCV: 87 fL (ref 79–97)
Monocytes Absolute: 0.6 10*3/uL (ref 0.1–0.9)
Monocytes: 8 %
NEUTROS PCT: 53 %
Neutrophils Absolute: 3.7 10*3/uL (ref 1.4–7.0)
Platelets: 361 10*3/uL (ref 150–450)
RBC: 4.41 x10E6/uL (ref 3.77–5.28)
RDW: 11.8 % (ref 11.7–15.4)
WBC: 7.1 10*3/uL (ref 3.4–10.8)

## 2018-11-06 LAB — HIV ANTIBODY (ROUTINE TESTING W REFLEX): HIV SCREEN 4TH GENERATION: NONREACTIVE

## 2018-11-06 LAB — HEPATITIS B SURFACE ANTIGEN: Hepatitis B Surface Ag: NEGATIVE

## 2018-11-06 LAB — TSH: TSH: 0.891 u[IU]/mL (ref 0.450–4.500)

## 2018-11-06 LAB — RPR: RPR: NONREACTIVE

## 2018-11-07 LAB — GC/CHLAMYDIA PROBE AMP
CHLAMYDIA, DNA PROBE: NEGATIVE
Neisseria gonorrhoeae by PCR: NEGATIVE

## 2018-11-23 ENCOUNTER — Telehealth: Payer: Self-pay | Admitting: Family Medicine

## 2018-11-23 ENCOUNTER — Other Ambulatory Visit: Payer: Self-pay

## 2018-11-23 DIAGNOSIS — Z111 Encounter for screening for respiratory tuberculosis: Secondary | ICD-10-CM

## 2018-11-23 DIAGNOSIS — F419 Anxiety disorder, unspecified: Principal | ICD-10-CM

## 2018-11-23 DIAGNOSIS — F329 Major depressive disorder, single episode, unspecified: Secondary | ICD-10-CM

## 2018-11-23 MED ORDER — ESCITALOPRAM OXALATE 10 MG PO TABS: 10.0000 mg | ORAL_TABLET | Freq: Every day | ORAL | 3 refills | Status: DC

## 2018-11-23 NOTE — Telephone Encounter (Signed)
Copied from [place] [id number]. Topic: Quick Communication - Rx Refill/Question >> Nov 23, 2018 12:45 PM Andria Frames L wrote: Medication: escitalopram (LEXAPRO) 5 MG tablet [[id number]]  pt called and stated that she will run out of medication and needs a call back from the nurse regarding pt states that she was instructed to take 1 a day for 1 week and then increase to 2 a day after that. Pt would like a call back from the nurse as soon as possible because pt will run out tonight. Please advise

## 2018-11-23 NOTE — Telephone Encounter (Signed)
Rx resent to pharmacy

## 2018-12-16 ENCOUNTER — Other Ambulatory Visit: Payer: Self-pay

## 2018-12-16 ENCOUNTER — Encounter: Payer: Self-pay | Admitting: Family Medicine

## 2018-12-16 ENCOUNTER — Ambulatory Visit (INDEPENDENT_AMBULATORY_CARE_PROVIDER_SITE_OTHER): Payer: 59 | Admitting: Family Medicine

## 2018-12-16 DIAGNOSIS — F329 Major depressive disorder, single episode, unspecified: Secondary | ICD-10-CM

## 2018-12-16 DIAGNOSIS — F419 Anxiety disorder, unspecified: Secondary | ICD-10-CM | POA: Diagnosis not present

## 2018-12-16 MED ORDER — ESCITALOPRAM OXALATE 20 MG PO TABS: 20.0000 mg | ORAL_TABLET | Freq: Every day | ORAL | 1 refills | Status: DC

## 2018-12-16 NOTE — Progress Notes (Signed)
Telemedicine Encounter- SOAP NOTE Established Patient  This telephone encounter was conducted with the patient's (or proxy's) verbal consent via audio telecommunications: yes/no: Yes Patient was instructed to have this encounter in a suitably private space; and to only have persons present to whom they give permission to participate. In addition, patient identity was confirmed by use of name plus two identifiers (DOB and address).  I discussed the limitations, risks, security and privacy concerns of performing an evaluation and management service by telephone and the availability of in person appointments. I also discussed with the patient that there may be a patient responsible charge related to this service. The patient expressed understanding and agreed to proceed.  I spent a total of TIME; 0 MIN TO 60 MIN: 15 minutes talking with the patient or their proxy.  Chief Complaint  Patient presents with  . follow up anxiety    per pt her insurance is requiring she use [facility].  Depression score :  9.  GAD score: 11    Subjective   Leah [name] is a 28 y.o. established patient. Telephone visit today for  HPI  Anxiety follow up Pt states that she is taking the lexapro as scheduled She states that she has irritability and mood swings She states that her anxiety worsened with the pandemic Her focus has disappeared and she feels like in the moment  She reports that she sleeps all night and could sleep all day She has to set an alarm to get up to go to work She feels like the bottom has fallen out  GAD 7 : Generalized Anxiety Score 12/16/2018 11/05/2018  Nervous, Anxious, on Edge 2 3  Control/stop worrying 3 2  Worry too much - different things 3 3  Trouble relaxing 1 2  Restless 0 1  Easily annoyed or irritable 2 3  Afraid - awful might happen 0 2  Total GAD 7 Score 11 16  Anxiety Difficulty Extremely difficult Very difficult    Depression screen [employer] 2/9 12/16/2018 11/05/2018  11/05/2018 09/21/2017 11/23/2015  Decreased Interest 3 3 0 0 0  Down, Depressed, Hopeless 1 2 0 0 0  PHQ - 2 Score 4 5 0 0 0  Altered sleeping 2 1 - - -  Tired, decreased energy 0 2 - - -  Change in appetite 0 1 - - -  Feeling bad or failure about yourself  0 3 - - -  Trouble concentrating 3 2 - - -  Moving slowly or fidgety/restless 0 0 - - -  Suicidal thoughts 0 0 - - -  PHQ-9 Score 9 14 - - -  Difficult doing work/chores Extremely dIfficult Somewhat difficult - - -     Patient Active Problem List   Diagnosis Date Noted  . Dermoid cyst of left ovary 11/23/2013    Past Medical History:  Diagnosis Date  . Asthma   . Herpes genitalia   . Ovarian cyst     Current Outpatient Medications  Medication Sig Dispense Refill  . albuterol (PROVENTIL HFA;VENTOLIN HFA) 108 (90 Base) MCG/ACT inhaler Inhale 2 puffs into the lungs every 6 (six) hours as needed for wheezing or shortness of breath.    . etonogestrel (NEXPLANON) 68 MG IMPL implant 1 each by Subdermal route once.    . valACYclovir (VALTREX) 500 MG tablet Take 500 mg by mouth daily.    Marland Kitchen escitalopram (LEXAPRO) 20 MG tablet Take 1 tablet (20 mg total) by mouth daily. 90 tablet 1  No current facility-administered medications for this visit.     Allergies  Allergen Reactions  . Penicillins Other (See Comments)    Family allergy  Has patient had a PCN reaction causing immediate rash, facial/tongue/throat swelling, SOB or lightheadedness with hypotension: No Has patient had a PCN reaction causing severe rash involving mucus membranes or skin necrosis: No Has patient had a PCN reaction that required hospitalization No Has patient had a PCN reaction occurring within the last 10 years: No If all of the above answers are "NO", then may proceed with Cephalosporin use.    Social History   Socioeconomic History  . Marital status: Single    Spouse name: Not on file  . Number of children: Not on file  . Years of education: Not on  file  . Highest education level: Not on file  Occupational History  . Not on file  Social Needs  . Financial resource strain: Not on file  . Food insecurity:    Worry: Not on file    Inability: Not on file  . Transportation needs:    Medical: Not on file    Non-medical: Not on file  Tobacco Use  . Smoking status: Never Smoker  . Smokeless tobacco: Never Used  Substance and Sexual Activity  . Alcohol use: No    Alcohol/week: 0.0 standard drinks    Comment: ETOh once a week, no binge drinker  . Drug use: No  . Sexual activity: Yes    Partners: Female, Female    Birth control/protection: Implant  Lifestyle  . Physical activity:    Days per week: Not on file    Minutes per session: Not on file  . Stress: Not on file  Relationships  . Social connections:    Talks on phone: Not on file    Gets together: Not on file    Attends religious service: Not on file    Active member of club or organization: Not on file    Attends meetings of clubs or organizations: Not on file    Relationship status: Not on file  . Intimate partner violence:    Fear of current or ex partner: Not on file    Emotionally abused: Not on file    Physically abused: Not on file    Forced sexual activity: Not on file  Other Topics Concern  . Not on file  Social History Narrative   Single   Current student at [employer]  Constitutional: Negative for activity change, appetite change, chills and fever.  HENT: Negative for congestion, nosebleeds, trouble swallowing and voice change.   Respiratory: Negative for cough, shortness of breath and wheezing.   Gastrointestinal: Negative for diarrhea, nausea and vomiting.  Genitourinary: Negative for difficulty urinating, dysuria, flank pain and hematuria.  Musculoskeletal: Negative for back pain, joint swelling and neck pain.  Neurological: Negative for dizziness, speech difficulty, light-headedness and numbness.  See HPI. All other review of  systems negative.   Objective   Vitals as reported by the patient: There were no vitals filed for this visit.  [name] was seen today for follow up anxiety.  Diagnoses and all orders for this visit:  Anxiety and depression-  Increased lexapro Advised counseling with [email or web address] to find a counselor Discussed exercise Patient is not in remission as yet Increased lexapro Plan for pt to add counseling and exercise  Other orders -     escitalopram (LEXAPRO) 20 MG tablet; Take 1 tablet (20  mg total) by mouth daily.     I discussed the assessment and treatment plan with the patient. The patient was provided an opportunity to ask questions and all were answered. The patient agreed with the plan and demonstrated an understanding of the instructions.   The patient was advised to call back or seek an in-person evaluation if the symptoms worsen or if the condition fails to improve as anticipated.  I provided 15 minutes of non-face-to-face time during this encounter.  Forrest Moron, MD  Primary Care at [facility]

## 2019-01-18 ENCOUNTER — Telehealth (INDEPENDENT_AMBULATORY_CARE_PROVIDER_SITE_OTHER): Payer: 59 | Admitting: Family Medicine

## 2019-01-18 DIAGNOSIS — F419 Anxiety disorder, unspecified: Secondary | ICD-10-CM | POA: Diagnosis not present

## 2019-01-18 DIAGNOSIS — Z3046 Encounter for surveillance of implantable subdermal contraceptive: Secondary | ICD-10-CM | POA: Diagnosis not present

## 2019-01-18 DIAGNOSIS — F329 Major depressive disorder, single episode, unspecified: Secondary | ICD-10-CM | POA: Diagnosis not present

## 2019-01-18 NOTE — Patient Instructions (Signed)
° ° ° °  If you have lab work done today you will be contacted with your lab results within the next 2 weeks.  If you have not heard from us then please contact us. The fastest way to get your results is to register for My Chart. ° ° °IF you received an x-ray today, you will receive an invoice from [facility]. Please contact [facility] at [phone] with questions or concerns regarding your invoice.  ° °IF you received labwork today, you will receive an invoice from [facility]. Please contact [facility] at [phone] with questions or concerns regarding your invoice.  ° °Our billing staff will not be able to assist you with questions regarding bills from these companies. ° °You will be contacted with the lab results as soon as they are available. The fastest way to get your results is to activate your My Chart account. Instructions are located on the last page of this paperwork. If you have not heard from us regarding the results in 2 weeks, please contact this office. °  ° ° ° °

## 2019-01-18 NOTE — Progress Notes (Signed)
CC: f/u depression per stallings. Per pt her nexplanon is due to come out in June and wanted to know if we can remove.  Advised we could and she'll need to call in June to schedule removal appt with stallings.  No recent weight or bp taken.  No travel outside the [place] or  in the past 3 weeks.  Depression score: 13

## 2019-01-18 NOTE — Progress Notes (Signed)
Telemedicine Encounter- SOAP NOTE Established Patient  This telephone encounter was conducted with the patient's (or proxy's) verbal consent via audio telecommunications: yes/no: Yes Patient was instructed to have this encounter in a suitably private space; and to only have persons present to whom they give permission to participate. In addition, patient identity was confirmed by use of name plus two identifiers (DOB and address).  I discussed the limitations, risks, security and privacy concerns of performing an evaluation and management service by telephone and the availability of in person appointments. I also discussed with the patient that there may be a patient responsible charge related to this service. The patient expressed understanding and agreed to proceed.  I spent a total of TIME; 0 MIN TO 60 MIN: 20 minutes talking with the patient or their proxy.  CC: Depression follow up   Subjective   Leah [name] is a 28 y.o. established patient. Telephone visit today for  HPI  Depression  She is currently in counseling and does worksheets during the week and reflections and can do "chats" about it.  The increased dose of lexapro in the morning has shown very little benefit She will be working a different schedule and will need to adjust her medication She denies panic attacks She is struggling with who postgraduate life is feeling like.  Depression screen [facility] 2/9 01/18/2019 12/16/2018 11/05/2018 11/05/2018 09/21/2017  Decreased Interest 1 3 3  0 0  Down, Depressed, Hopeless 2 1 2  0 0  PHQ - 2 Score 3 4 5  0 0  Altered sleeping 1 2 1  - -  Tired, decreased energy 1 0 2 - -  Change in appetite 1 0 1 - -  Feeling bad or failure about yourself  0 0 3 - -  Trouble concentrating 2 3 2  - -  Moving slowly or fidgety/restless 3 0 0 - -  Suicidal thoughts 0 0 0 - -  PHQ-9 Score 11 9 14  - -  Difficult doing work/chores Extremely dIfficult Extremely dIfficult Somewhat difficult - -   Nexplanon  She reports that she would like to get her nexplanon removed It was placed February 20, 2019 She would not get another nexplanon So she would just want to have it removed Her periods were irregular and her moods might be affected so she wants to get off hormonal meds     Patient Active Problem List   Diagnosis Date Noted  . Dermoid cyst of left ovary 11/23/2013    Past Medical History:  Diagnosis Date  . Asthma   . Herpes genitalia   . Ovarian cyst     Current Outpatient Medications  Medication Sig Dispense Refill  . escitalopram (LEXAPRO) 20 MG tablet Take 1 tablet (20 mg total) by mouth daily. 90 tablet 1  . etonogestrel (NEXPLANON) 68 MG IMPL implant 1 each by Subdermal route once.    . valACYclovir (VALTREX) 500 MG tablet Take 500 mg by mouth daily.    Marland Kitchen albuterol (PROVENTIL HFA;VENTOLIN HFA) 108 (90 Base) MCG/ACT inhaler Inhale 2 puffs into the lungs every 6 (six) hours as needed for wheezing or shortness of breath.     No current facility-administered medications for this visit.     Allergies  Allergen Reactions  . Penicillins Other (See Comments)    Family allergy  Has patient had a PCN reaction causing immediate rash, facial/tongue/throat swelling, SOB or lightheadedness with hypotension: No Has patient had a PCN reaction causing severe rash involving mucus membranes or skin  necrosis: No Has patient had a PCN reaction that required hospitalization No Has patient had a PCN reaction occurring within the last 10 years: No If all of the above answers are "NO", then may proceed with Cephalosporin use.    Social History   Socioeconomic History  . Marital status: Single    Spouse name: Not on file  . Number of children: Not on file  . Years of education: Not on file  . Highest education level: Not on file  Occupational History  . Not on file  Social Needs  . Financial resource strain: Not on file  . Food insecurity:    Worry: Not on file    Inability: Not on file   . Transportation needs:    Medical: Not on file    Non-medical: Not on file  Tobacco Use  . Smoking status: Never Smoker  . Smokeless tobacco: Never Used  Substance and Sexual Activity  . Alcohol use: No    Alcohol/week: 0.0 standard drinks    Comment: ETOh once a week, no binge drinker  . Drug use: No  . Sexual activity: Yes    Partners: Female, Female    Birth control/protection: Implant  Lifestyle  . Physical activity:    Days per week: Not on file    Minutes per session: Not on file  . Stress: Not on file  Relationships  . Social connections:    Talks on phone: Not on file    Gets together: Not on file    Attends religious service: Not on file    Active member of club or organization: Not on file    Attends meetings of clubs or organizations: Not on file    Relationship status: Not on file  . Intimate partner violence:    Fear of current or ex partner: Not on file    Emotionally abused: Not on file    Physically abused: Not on file    Forced sexual activity: Not on file  Other Topics Concern  . Not on file  Social History Narrative   Single   Current student at [employer]  Constitutional: Negative for activity change, appetite change, chills and fever.  HENT: Negative for congestion, nosebleeds, trouble swallowing and voice change.   Respiratory: Negative for cough, shortness of breath and wheezing.   Gastrointestinal: Negative for diarrhea, nausea and vomiting.  Genitourinary: Negative for difficulty urinating, dysuria, flank pain and hematuria.  Musculoskeletal: Negative for back pain, joint swelling and neck pain.  Neurological: Negative for dizziness, speech difficulty, light-headedness and numbness.  See HPI. All other review of systems negative.   Objective   Vitals as reported by the patient: There were no vitals filed for this visit.  Diagnoses and all orders for this visit:  Anxiety and depression- discussed her current  medication and she seems stable on the current dose   Encounter for surveillance of implantable subdermal contraceptive- return to nexplanon removal     I discussed the assessment and treatment plan with the patient. The patient was provided an opportunity to ask questions and all were answered. The patient agreed with the plan and demonstrated an understanding of the instructions.   The patient was advised to call back or seek an in-person evaluation if the symptoms worsen or if the condition fails to improve as anticipated.  I provided 20 minutes of non-face-to-face time during this encounter.  Forrest Moron, MD  Primary Care at [facility]

## 2019-01-21 ENCOUNTER — Encounter: Payer: 59 | Admitting: Family Medicine

## 2019-01-25 ENCOUNTER — Telehealth: Payer: 59 | Admitting: Physician Assistant

## 2019-01-25 DIAGNOSIS — R300 Dysuria: Secondary | ICD-10-CM | POA: Diagnosis not present

## 2019-01-25 MED ORDER — NITROFURANTOIN MONOHYD MACRO 100 MG PO CAPS: 100.0000 mg | ORAL_CAPSULE | Freq: Two times a day (BID) | ORAL | 0 refills | Status: DC

## 2019-01-25 NOTE — Progress Notes (Signed)
We are sorry that you are not feeling well.  Here is how we plan to help!  Based on what you shared with me it looks like you most likely have a simple urinary tract infection.  A UTI (Urinary Tract Infection) is a bacterial infection of the bladder.  Most cases of urinary tract infections are simple to treat but a key part of your care is to encourage you to drink plenty of fluids and watch your symptoms carefully.  I have prescribed MacroBid 100 mg twice a day for 5 days.  Your symptoms should gradually improve. Call us if the burning in your urine worsens, you develop worsening fever, back pain or pelvic pain or if your symptoms do not resolve after completing the antibiotic.  Urinary tract infections can be prevented by drinking plenty of water to keep your body hydrated.  Also be sure when you wipe, wipe from front to back and don't hold it in!  If possible, empty your bladder every 4 hours.  Your e-visit answers were reviewed by a board certified advanced clinical practitioner to complete your personal care plan.  Depending on the condition, your plan could have included both over the counter or prescription medications.  If there is a problem please reply once you have received a response from your provider.  Your safety is important to [place].  If you have drug allergies check your prescription carefully.    You can use MyChart to ask questions about today's visit, request a non-urgent call back, or ask for a work or school excuse for 24 hours related to this e-Visit. If it has been greater than 24 hours you will need to follow up with your provider, or enter a new e-Visit to address those concerns.   You will get an e-mail in the next two days asking about your experience.  I hope that your e-visit has been valuable and will speed your recovery. Thank you for using e-visits.    ===View-only below this line===   ----- Message -----    From: Leah [name]    Sent: 01/25/2019 10:14  AM EDT      To: E-Visit Mailing List Subject: E-Visit Submission: Urinary Problems  E-Visit Submission: Urinary Problems --------------------------------  Question: Which of the following are you experiencing? Answer:   Pain while passing urine  Question: When you have pain when passing urine, which of these apply? Answer:   The pain feels like it is on the inside  Question: Are you able to pass urine? Answer:   Yes, I can pass urine.  Question: How long have you had pain or difficulty passing urine? Answer:   More  than two days but less than one week  Question: Do you have a fever? Answer:   No, I do not have a fever  Question: Do you have any of the following? Answer:   I have belly pain with this illness            I have diarrhea  Question: Do you have an exaggerated sensation of the need to pass urine? Answer:   Yes, the sensation is exaggerated  Question: Do you have the urge to urinate more of less frequently than normal? Answer:   More frequently  Question: What does your urine look like? Answer:   I am not sure  Question: Do you have any of the following? Answer:   None of the above  Question: Do you have any of the following? Answer:  No discharge  Question: Do you have any sores on your genitals? Answer:   No  Question: Do you have any history of kidney dysfunction or kidney problems? Answer:   No  Question: Within the past 3 months, have you had any surgery on your kidneys or bladder, or have you had a tube inserted to collect your urine? Answer:   No, I have never had either  Question: Have you had similar symptoms in the past? Answer:   Yes, I have had similar symptoms more than once before  Question: If you had similar symptoms in the past, did any of the following work? Answer:   Pills for urine infection            Cranberry juice            Pills for yeast infection            Cream for yeast infection  Question: Please list any  additional comments  Answer:   Due to work life I have been holding my bladder more and for longer times. The first day I felt pain and barely has to pee I bought over the counter uti pain pill to help easy the pain. I am constantly drinking water or coffee which cases me to have to go when I have to go.  Question: Please list your medication allergies that you may have ? (If 'none' , please list as 'none') Answer:   Penicillin and amoxicillin  Question: Are you pregnant? Answer:   I am confident that I am not pregnant  Question: Are you breastfeeding? Answer:   No  A total of 5-10 minutes was spent evaluating this patients questionnaire and formulating a plan of care.

## 2019-01-26 MED ORDER — NITROFURANTOIN MONOHYD MACRO 100 MG PO CAPS: 100.0000 mg | ORAL_CAPSULE | Freq: Two times a day (BID) | ORAL | 0 refills | Status: AC

## 2019-01-26 NOTE — Addendum Note (Signed)
Addended by: Evelina Dun A on: 01/26/2019 04:51 PM   Modules accepted: Orders

## 2019-02-09 NOTE — Progress Notes (Signed)
This encounter was created in error - please disregard.

## 2019-02-10 ENCOUNTER — Other Ambulatory Visit: Payer: Self-pay

## 2019-02-10 ENCOUNTER — Encounter: Payer: Self-pay | Admitting: Family Medicine

## 2019-02-10 ENCOUNTER — Ambulatory Visit (INDEPENDENT_AMBULATORY_CARE_PROVIDER_SITE_OTHER): Payer: 59 | Admitting: Family Medicine

## 2019-02-10 VITALS — BP 107/71 | HR 103 | Temp 98.8°F | Resp 17 | Ht 65.5 in | Wt 159.2 lb

## 2019-02-10 DIAGNOSIS — Z3046 Encounter for surveillance of implantable subdermal contraceptive: Secondary | ICD-10-CM

## 2019-02-10 DIAGNOSIS — F329 Major depressive disorder, single episode, unspecified: Secondary | ICD-10-CM

## 2019-02-10 DIAGNOSIS — F419 Anxiety disorder, unspecified: Secondary | ICD-10-CM | POA: Diagnosis not present

## 2019-02-10 NOTE — Patient Instructions (Addendum)
If you have lab work done today you will be contacted with your lab results within the next 2 weeks.  If you have not heard from [place] then please contact us. The fastest way to get your results is to register for My Chart.   IF you received an x-ray today, you will receive an invoice from [facility]. Please contact [facility] at [phone] with questions or concerns regarding your invoice.   IF you received labwork today, you will receive an invoice from [facility]. Please contact [facility] at [phone] with questions or concerns regarding your invoice.   Our billing staff will not be able to assist you with questions regarding bills from these companies.  You will be contacted with the lab results as soon as they are available. The fastest way to get your results is to activate your My Chart account. Instructions are located on the last page of this paperwork. If you have not heard from [place] regarding the results in 2 weeks, please contact this office.     Primary Amenorrhea  Primary amenorrhea is when a female has not started having periods by the age of 57 years. What are the causes? This condition may be caused by:  An abnormal chromosome that causes the ovaries not to work properly (common).  Malnutrition.  Low blood sugar (hypoglycemia).  Polycystic ovary syndrome.  Being born without a vagina, uterus, or ovaries.  Extreme obesity.  Cystic fibrosis.  Drastic weight loss.  Over-exercising that leads to a loss of body fat.  Pituitary gland tumor in the brain.  Long-term illnesses.  Cushing disease.  A thyroid disease, such as hypothyroidism or hyperthyroidism.  A part of the brain called the hypothalamus not working normally.  Premature ovarian failure. What are the signs or symptoms? The main symptom of this condition is not having had a period by age 63 years. Other symptoms include:  Discharge from the breasts.  Hot  flashes.  Adult acne.  Facial or chest hair.  Headaches.  Impaired vision.  Recent excessive stress.  Changes in weight, diet, or exercise patterns. How is this diagnosed? This condition may be diagnosed based on:  Your medical history.  A physical exam.  Tests, such as: ? Blood tests. ? Urine tests. How is this treated? Treatment for this condition depends on the cause. For example, an absence of sex organs will require surgery to correct the problem. Others causes may respond when treated with medicine. Follow these instructions at home:  Maintain a healthy diet.  Maintain a healthy weight.  Exercise regularly but not excessively.  Take over-the-counter and prescription medicines only as told by your health care provider.  Keep all follow-up visits as told by your health care provider. This is important. Contact a health care provider if:  Your body is not developing at the level of your peers.  You have pelvic pain.  You gain an unusual amount of weight.  You have an unusual amount of hair growth. Summary  Primary amenorrhea is when a female has not started having periods by the age of 79 years.  This condition has many causes, including abnormal ovaries, obesity, extreme weight loss.  Contact a health care provider if your body is not developing at the level of your peers. This information is not intended to replace advice given to you by your health care provider. Make sure you discuss any questions you have with your health care provider. Document Released: 09/01/2005 Document Revised: 11/18/2016  Document Reviewed: 11/18/2016 Elsevier Interactive Patient Education  [employer].

## 2019-02-10 NOTE — Progress Notes (Signed)
Established Patient Office Visit  Subjective:  Patient ID: Leah [name], female    DOB: [birth year]  Age: 28 y.o. MRN: [id number]  CC:  Chief Complaint  Patient presents with  . nexplanon removal    HPI Leah [name] presents for   Patient here for nexplanon removal No LMP recorded. Patient has had an implant. She reports that she has a history of abnormal periods and sometimes only has one period every 3-6 months. She is not sexually active.  She states that she has better moods She states that she has been doing well on the new dose of Celexa Depression screen [facility] 2/9 02/10/2019 01/18/2019 12/16/2018 11/05/2018 11/05/2018  Decreased Interest 0 1 3 3  0  Down, Depressed, Hopeless 0 2 1 2  0  PHQ - 2 Score 0 3 4 5  0  Altered sleeping - 1 2 1  -  Tired, decreased energy - 1 0 2 -  Change in appetite - 1 0 1 -  Feeling bad or failure about yourself  - 0 0 3 -  Trouble concentrating - 2 3 2  -  Moving slowly or fidgety/restless - 3 0 0 -  Suicidal thoughts - 0 0 0 -  PHQ-9 Score - 11 9 14  -  Difficult doing work/chores - Extremely dIfficult Extremely dIfficult Somewhat difficult -   She graduated so that also helped her mood.    Past Medical History:  Diagnosis Date  . Asthma   . Herpes genitalia   . Ovarian cyst     Past Surgical History:  Procedure Laterality Date  . EYE MUSCLE SURGERY    . OVARIAN CYST REMOVAL      Family History  Problem Relation Age of Onset  . Asthma Father   . Hyperlipidemia Father   . Asthma Brother   . Cancer Mother        lung  . Asthma Sister   . Asthma Paternal Aunt   . Asthma Paternal Grandmother     Social History   Socioeconomic History  . Marital status: Single    Spouse name: Not on file  . Number of children: Not on file  . Years of education: Not on file  . Highest education level: Not on file  Occupational History  . Not on file  Social Needs  . Financial resource strain: Not on file  . Food insecurity:    Worry:  Not on file    Inability: Not on file  . Transportation needs:    Medical: Not on file    Non-medical: Not on file  Tobacco Use  . Smoking status: Never Smoker  . Smokeless tobacco: Never Used  Substance and Sexual Activity  . Alcohol use: No    Alcohol/week: 0.0 standard drinks    Comment: ETOh once a week, no binge drinker  . Drug use: No  . Sexual activity: Yes    Partners: Female, Female    Birth control/protection: Implant  Lifestyle  . Physical activity:    Days per week: Not on file    Minutes per session: Not on file  . Stress: Not on file  Relationships  . Social connections:    Talks on phone: Not on file    Gets together: Not on file    Attends religious service: Not on file    Active member of club or organization: Not on file    Attends meetings of clubs or organizations: Not on file    Relationship status: Not on file  .  Intimate partner violence:    Fear of current or ex partner: Not on file    Emotionally abused: Not on file    Physically abused: Not on file    Forced sexual activity: Not on file  Other Topics Concern  . Not on file  Social History Narrative   Single   Current student at [employer]    Outpatient Medications Prior to Visit  Medication Sig Dispense Refill  . albuterol (PROVENTIL HFA;VENTOLIN HFA) 108 (90 Base) MCG/ACT inhaler Inhale 2 puffs into the lungs every 6 (six) hours as needed for wheezing or shortness of breath.    . escitalopram (LEXAPRO) 20 MG tablet Take 1 tablet (20 mg total) by mouth daily. 90 tablet 1  . valACYclovir (VALTREX) 500 MG tablet Take 500 mg by mouth daily.    Marland Kitchen etonogestrel (NEXPLANON) 68 MG IMPL implant 1 each by Subdermal route once.     No facility-administered medications prior to visit.     Allergies  Allergen Reactions  . Penicillins Other (See Comments)    Family allergy  Has patient had a PCN reaction causing immediate rash, facial/tongue/throat swelling, SOB or lightheadedness with hypotension: No Has  patient had a PCN reaction causing severe rash involving mucus membranes or skin necrosis: No Has patient had a PCN reaction that required hospitalization No Has patient had a PCN reaction occurring within the last 10 years: No If all of the above answers are "NO", then may proceed with Cephalosporin use.    ROS Review of Systems Review of Systems  Constitutional: Negative for activity change, appetite change, chills and fever.  HENT: Negative for congestion, nosebleeds, trouble swallowing and voice change.   Respiratory: Negative for cough, shortness of breath and wheezing.   Gastrointestinal: Negative for diarrhea, nausea and vomiting.  Genitourinary: Negative for difficulty urinating, dysuria, flank pain and hematuria.  Musculoskeletal: Negative for back pain, joint swelling and neck pain.  Neurological: Negative for dizziness, speech difficulty, light-headedness and numbness.  See HPI. All other review of systems negative.     Objective:    Physical Exam  BP 107/71 (BP Location: Right Arm, Patient Position: Sitting, Cuff Size: Normal)   Pulse (!) 103   Temp 98.8 F (37.1 C) (Oral)   Resp 17   Ht 5' 5.5" (1.664 m)   Wt 159 lb 3.2 oz (72.2 kg)   SpO2 99%   BMI 26.09 kg/m  Wt Readings from Last 3 Encounters:  02/10/19 159 lb 3.2 oz (72.2 kg)  11/05/18 155 lb 3.2 oz (70.4 kg)  09/21/17 135 lb (61.2 kg)   Physical Exam  Constitutional: Oriented to person, place, and time. Appears well-developed and well-nourished.  HENT:  Head: Normocephalic and atraumatic.  Eyes: Conjunctivae and EOM are normal.  Pulmonary/Chest: Effort normal  Neurological: Is alert and oriented to person, place, and time.  Skin: Skin is warm. Capillary refill takes less than 2 seconds.  Psychiatric: Has a normal mood and affect. Behavior is normal. Judgment and thought content normal.   Palpated nexplanon in the left arm   Health Maintenance Due  Topic Date Due  . PAP-Cervical Cytology Screening   09/06/2014  . PAP SMEAR-Modifier  09/06/2014    There are no preventive care reminders to display for this patient.  Lab Results  Component Value Date   TSH 0.891 11/05/2018   Lab Results  Component Value Date   WBC 7.1 11/05/2018   HGB 12.5 11/05/2018   HCT 38.5 11/05/2018   MCV 87 11/05/2018  PLT 361 11/05/2018   Lab Results  Component Value Date   NA 139 11/23/2015   K 4.8 11/23/2015   CO2 26 11/23/2015   GLUCOSE 87 11/23/2015   BUN 8 11/23/2015   CREATININE 0.83 11/23/2015   BILITOT 0.2 11/23/2015   ALKPHOS 43 11/23/2015   AST 15 11/23/2015   ALT 10 11/23/2015   PROT 7.0 11/23/2015   ALBUMIN 4.0 11/23/2015   CALCIUM 9.3 11/23/2015   ANIONGAP 10 11/07/2015   Lab Results  Component Value Date   CHOL 147 09/21/2017   Lab Results  Component Value Date   HDL 58 09/21/2017   Lab Results  Component Value Date   LDLCALC 79 09/21/2017   Lab Results  Component Value Date   TRIG 49 09/21/2017   Lab Results  Component Value Date   CHOLHDL 2.5 09/21/2017   No results found for: HGBA1C    Assessment & Plan:   Problem List Items Addressed This Visit    None    Visit Diagnoses    Nexplanon removal    -  Primary Removed without complication    Anxiety and depression    Stable on current medication Continue current counseling         Nexplanon Implant Removal Procedure Note  After discussion of contraception options, the patient wishes to proceed with removal of the Nexplanon device. Written informed consent was obtained and the procedure was reviewed.  Prior to the procedure being performed, a "time out" was performed that confirmed the correct patient, procedure and site.  The implant capsule was located and the site was cleaned with alcohol. With injection of 1-2 ml of 1% lidocaine, local anesthesia was achieved. The area was prepped with betadine. Using sterile technique, a stab incision with the scalpel was made, the implant capsule was grasped  with a hemostat and removed. The provider and patient verified that the capsule was removed intact. Pressure was applied to the area and steri-strips were used to close the skin opening.  A pressure dressing was applied.   The patient tolerated the procedure well, with minimal blood loss and no complications.  She was instructed to remove the pressure bandage this evening, the band aid after 24 hours, and the steri-strips in 5-7 days. She was instructed to call for increasing pain, fever, redness, warmth, pus, or any concern for infection.  She was advised to take acetaminophen and/or ibuprofen as needed for pain relief.       No orders of the defined types were placed in this encounter.   Follow-up: No follow-ups on file.    Forrest Moron, MD

## 2019-03-23 ENCOUNTER — Ambulatory Visit (INDEPENDENT_AMBULATORY_CARE_PROVIDER_SITE_OTHER): Payer: 59 | Admitting: Family Medicine

## 2019-03-23 ENCOUNTER — Other Ambulatory Visit: Payer: Self-pay

## 2019-03-23 DIAGNOSIS — B002 Herpesviral gingivostomatitis and pharyngotonsillitis: Secondary | ICD-10-CM | POA: Diagnosis not present

## 2019-03-23 DIAGNOSIS — J4520 Mild intermittent asthma, uncomplicated: Secondary | ICD-10-CM | POA: Diagnosis not present

## 2019-03-23 MED ORDER — ALBUTEROL SULFATE HFA 108 (90 BASE) MCG/ACT IN AERS: 2.0000 | INHALATION_SPRAY | Freq: Four times a day (QID) | RESPIRATORY_TRACT | 1 refills | Status: AC | PRN

## 2019-03-23 MED ORDER — VALACYCLOVIR HCL 500 MG PO TABS: 500.0000 mg | ORAL_TABLET | Freq: Every day | ORAL | 3 refills | Status: DC

## 2019-03-23 NOTE — Progress Notes (Signed)
Established Patient Office Visit  Subjective:  Patient ID: Leah [name], female    DOB: [birth year]  Age: 28 y.o. MRN: [id number]  CC:  Chief Complaint  Patient presents with  . discuss valtrex, pt has sores in mouth    per pt sores gone since x 2 days ago, initial bump appeared and burst and then another one came.  Per pt started taking the valtrex daily and it didn't help althought pt admits she doesn't take medication as directed always  . Medication Refill    albuterol inhaler    HPI Leah [name] presents for  HSV Pt reports that she had oral lesions below her tongue  She states that she was on suppressive treatment for valtrex but increased her dose for outbreak She was diagnosed with genital herpes but did not understand why she would have a lesion if she is on the medication She was performing oral sex around the same time  This would be her second outbreak in 8 years She reports that the blister has resolved  Asthma  She reports that she has stable asthma She reports that the mask makes her feel short of breath She would like to get her albuterol inhaler refilled  It expired back in 2017  Past Medical History:  Diagnosis Date  . Asthma   . Herpes genitalia   . Ovarian cyst     Past Surgical History:  Procedure Laterality Date  . EYE MUSCLE SURGERY    . OVARIAN CYST REMOVAL      Family History  Problem Relation Age of Onset  . Asthma Father   . Hyperlipidemia Father   . Asthma Brother   . Cancer Mother        lung  . Asthma Sister   . Asthma Paternal Aunt   . Asthma Paternal Grandmother     Social History   Socioeconomic History  . Marital status: Single    Spouse name: Not on file  . Number of children: Not on file  . Years of education: Not on file  . Highest education level: Not on file  Occupational History  . Not on file  Social Needs  . Financial resource strain: Not on file  . Food insecurity    Worry: Not on file   Inability: Not on file  . Transportation needs    Medical: Not on file    Non-medical: Not on file  Tobacco Use  . Smoking status: Never Smoker  . Smokeless tobacco: Never Used  Substance and Sexual Activity  . Alcohol use: No    Alcohol/week: 0.0 standard drinks    Comment: ETOh once a week, no binge drinker  . Drug use: No  . Sexual activity: Yes    Partners: Female, Female    Birth control/protection: Implant  Lifestyle  . Physical activity    Days per week: Not on file    Minutes per session: Not on file  . Stress: Not on file  Relationships  . Social Herbalist on phone: Not on file    Gets together: Not on file    Attends religious service: Not on file    Active member of club or organization: Not on file    Attends meetings of clubs or organizations: Not on file    Relationship status: Not on file  . Intimate partner violence    Fear of current or ex partner: Not on file    Emotionally abused: Not on file  Physically abused: Not on file    Forced sexual activity: Not on file  Other Topics Concern  . Not on file  Social History Narrative   Single   Current student at [employer]    Outpatient Medications Prior to Visit  Medication Sig Dispense Refill  . escitalopram (LEXAPRO) 20 MG tablet Take 1 tablet (20 mg total) by mouth daily. 90 tablet 1  . valACYclovir (VALTREX) 500 MG tablet Take 500 mg by mouth daily.    Marland Kitchen albuterol (PROVENTIL HFA;VENTOLIN HFA) 108 (90 Base) MCG/ACT inhaler Inhale 2 puffs into the lungs every 6 (six) hours as needed for wheezing or shortness of breath.    . etonogestrel (NEXPLANON) 68 MG IMPL implant 1 each by Subdermal route once.     No facility-administered medications prior to visit.     Allergies  Allergen Reactions  . Penicillins Other (See Comments)    Family allergy  Has patient had a PCN reaction causing immediate rash, facial/tongue/throat swelling, SOB or lightheadedness with hypotension: No Has patient had a PCN  reaction causing severe rash involving mucus membranes or skin necrosis: No Has patient had a PCN reaction that required hospitalization No Has patient had a PCN reaction occurring within the last 10 years: No If all of the above answers are "NO", then may proceed with Cephalosporin use.    ROS Review of Systems Review of Systems  Constitutional: Negative for activity change, appetite change, chills and fever.  HENT: Negative for congestion, nosebleeds, trouble swallowing and voice change.   Respiratory: Negative for cough, shortness of breath and wheezing.   Gastrointestinal: Negative for diarrhea, nausea and vomiting.  Genitourinary: Negative for difficulty urinating, dysuria, flank pain and hematuria.  Musculoskeletal: Negative for back pain, joint swelling and neck pain.  Neurological: Negative for dizziness, speech difficulty, light-headedness and numbness.  See HPI. All other review of systems negative.     Objective:    Physical Exam  Constitutional: She is oriented to person, place, and time. She appears well-developed and well-nourished.  HENT:  Head: Normocephalic and atraumatic.  No visible vesicles, no lesion on oral exam  Pulmonary/Chest: Effort normal.  Neurological: She is alert and oriented to person, place, and time. She has normal reflexes.  Psychiatric: She has a normal mood and affect. Her behavior is normal. Judgment and thought content normal.    BP 102/64 (BP Location: Right Arm, Patient Position: Sitting, Cuff Size: Normal)   Pulse (!) 105   Temp 98.9 F (37.2 C) (Oral)   Resp 17   Ht 5' 5.5" (1.664 m)   Wt 158 lb 3.2 oz (71.8 kg)   LMP 03/02/2019   SpO2 100%   BMI 25.93 kg/m  Wt Readings from Last 3 Encounters:  03/23/19 158 lb 3.2 oz (71.8 kg)  02/10/19 159 lb 3.2 oz (72.2 kg)  11/05/18 155 lb 3.2 oz (70.4 kg)     Health Maintenance Due  Topic Date Due  . PAP-Cervical Cytology Screening  09/06/2014  . PAP SMEAR-Modifier  09/06/2014     There are no preventive care reminders to display for this patient.  Lab Results  Component Value Date   TSH 0.891 11/05/2018   Lab Results  Component Value Date   WBC 7.1 11/05/2018   HGB 12.5 11/05/2018   HCT 38.5 11/05/2018   MCV 87 11/05/2018   PLT 361 11/05/2018   Lab Results  Component Value Date   NA 139 11/23/2015   K 4.8 11/23/2015   CO2 26 11/23/2015  GLUCOSE 87 11/23/2015   BUN 8 11/23/2015   CREATININE 0.83 11/23/2015   BILITOT 0.2 11/23/2015   ALKPHOS 43 11/23/2015   AST 15 11/23/2015   ALT 10 11/23/2015   PROT 7.0 11/23/2015   ALBUMIN 4.0 11/23/2015   CALCIUM 9.3 11/23/2015   ANIONGAP 10 11/07/2015   Lab Results  Component Value Date   CHOL 147 09/21/2017   Lab Results  Component Value Date   HDL 58 09/21/2017   Lab Results  Component Value Date   LDLCALC 79 09/21/2017   Lab Results  Component Value Date   TRIG 49 09/21/2017   Lab Results  Component Value Date   CHOLHDL 2.5 09/21/2017   No results found for: HGBA1C    Assessment & Plan:   Problem List Items Addressed This Visit    None      No orders of the defined types were placed in this encounter.   Follow-up: No follow-ups on file.    Forrest Moron, MD

## 2019-03-23 NOTE — Patient Instructions (Signed)
° ° ° °  If you have lab work done today you will be contacted with your lab results within the next 2 weeks.  If you have not heard from us then please contact us. The fastest way to get your results is to register for My Chart. ° ° °IF you received an x-ray today, you will receive an invoice from [facility]. Please contact [facility] at [phone] with questions or concerns regarding your invoice.  ° °IF you received labwork today, you will receive an invoice from [facility]. Please contact [facility] at [phone] with questions or concerns regarding your invoice.  ° °Our billing staff will not be able to assist you with questions regarding bills from these companies. ° °You will be contacted with the lab results as soon as they are available. The fastest way to get your results is to activate your My Chart account. Instructions are located on the last page of this paperwork. If you have not heard from us regarding the results in 2 weeks, please contact this office. °  ° ° ° °

## 2019-04-18 ENCOUNTER — Other Ambulatory Visit: Payer: Self-pay | Admitting: Family Medicine

## 2019-04-19 NOTE — Telephone Encounter (Signed)
Requested Prescriptions  Pending Prescriptions Disp Refills  . escitalopram (LEXAPRO) 20 MG tablet [Pharmacy Med Name: ESCITALOPRAM  20MG   TAB] 90 tablet 1    Sig: TAKE 1 TABLET BY MOUTH  DAILY     Psychiatry:  Antidepressants - SSRI Failed - 04/18/2019 10:47 PM      Failed - Completed PHQ-2 or PHQ-9 in the last 360 days.      Passed - Valid encounter within last 6 months    Recent Outpatient Visits          3 weeks ago Oral herpes   Primary Care at [facility], Arlie Solomons, MD   2 months ago Nexplanon removal   Primary Care at [facility], Arlie Solomons, MD   4 months ago Anxiety and depression   Primary Care at [facility], Arlie Solomons, MD   5 months ago Encounter for health maintenance examination in adult   Primary Care at [facility], Arlie Solomons, MD   1 year ago Health maintenance examination   Primary Care at [facility], Arlie Solomons, MD

## 2019-04-25 ENCOUNTER — Telehealth: Payer: Self-pay | Admitting: Family Medicine

## 2019-04-25 NOTE — Telephone Encounter (Signed)
Pt has dropped a form-Staff Health Assessment for Dr. Nolon Rod to fill out. Please fax when complete to [phone]

## 2019-04-27 NOTE — Telephone Encounter (Signed)
Please advise 

## 2019-04-29 NOTE — Telephone Encounter (Signed)
Form completed and faxed to number provided.  Pt notified and request original form be sent via mail to her home address.  Mailed to home address as requested. Dgaddy, CMA

## 2019-06-12 ENCOUNTER — Encounter: Payer: Self-pay | Admitting: Family Medicine

## 2019-06-17 ENCOUNTER — Other Ambulatory Visit: Payer: Self-pay

## 2019-06-17 ENCOUNTER — Emergency Department (HOSPITAL_COMMUNITY): Payer: 59

## 2019-06-17 ENCOUNTER — Encounter (HOSPITAL_COMMUNITY): Payer: Self-pay

## 2019-06-17 ENCOUNTER — Ambulatory Visit (INDEPENDENT_AMBULATORY_CARE_PROVIDER_SITE_OTHER)
Admission: EM | Admit: 2019-06-17 | Discharge: 2019-06-17 | Disposition: A | Discharge status: Other Acute Inpatient Hospital | Payer: 59 | Source: Home / Self Care | Attending: Family Medicine | Admitting: Family Medicine

## 2019-06-17 ENCOUNTER — Emergency Department (HOSPITAL_COMMUNITY)
Admission: EM | Admit: 2019-06-17 | Discharge: 2019-06-17 | Disposition: A | Discharge status: Home / Self Care | Payer: 59 | Attending: Emergency Medicine | Admitting: Emergency Medicine

## 2019-06-17 ENCOUNTER — Ambulatory Visit: Payer: Self-pay | Admitting: *Deleted

## 2019-06-17 DIAGNOSIS — J45909 Unspecified asthma, uncomplicated: Secondary | ICD-10-CM | POA: Insufficient documentation

## 2019-06-17 DIAGNOSIS — R1032 Left lower quadrant pain: Secondary | ICD-10-CM

## 2019-06-17 DIAGNOSIS — R102 Pelvic and perineal pain: Secondary | ICD-10-CM | POA: Insufficient documentation

## 2019-06-17 DIAGNOSIS — Z880 Allergy status to penicillin: Secondary | ICD-10-CM | POA: Insufficient documentation

## 2019-06-17 DIAGNOSIS — Z79899 Other long term (current) drug therapy: Secondary | ICD-10-CM | POA: Insufficient documentation

## 2019-06-17 DIAGNOSIS — Z3202 Encounter for pregnancy test, result negative: Secondary | ICD-10-CM

## 2019-06-17 DIAGNOSIS — N72 Inflammatory disease of cervix uteri: Secondary | ICD-10-CM | POA: Diagnosis not present

## 2019-06-17 LAB — URINALYSIS, ROUTINE W REFLEX MICROSCOPIC
Bacteria, UA: NONE SEEN
Bilirubin Urine: NEGATIVE
Glucose, UA: NEGATIVE mg/dL
Ketones, ur: NEGATIVE mg/dL
Leukocytes,Ua: NEGATIVE
Nitrite: NEGATIVE
Protein, ur: NEGATIVE mg/dL
Specific Gravity, Urine: 1.02 (ref 1.005–1.030)
pH: 7 (ref 5.0–8.0)

## 2019-06-17 LAB — CBC
HCT: 39.8 % (ref 36.0–46.0)
Hemoglobin: 13 g/dL (ref 12.0–15.0)
MCH: 29.6 pg (ref 26.0–34.0)
MCHC: 32.7 g/dL (ref 30.0–36.0)
MCV: 90.7 fL (ref 80.0–100.0)
Platelets: 332 10*3/uL (ref 150–400)
RBC: 4.39 MIL/uL (ref 3.87–5.11)
RDW: 11.9 % (ref 11.5–15.5)
WBC: 5 10*3/uL (ref 4.0–10.5)
nRBC: 0 % (ref 0.0–0.2)

## 2019-06-17 LAB — POCT PREGNANCY, URINE: Preg Test, Ur: NEGATIVE

## 2019-06-17 LAB — I-STAT BETA HCG BLOOD, ED (MC, WL, AP ONLY): I-stat hCG, quantitative: <5 m[IU]/mL (ref <5)

## 2019-06-17 LAB — WET PREP, GENITAL
Sperm: NONE SEEN
Trich, Wet Prep: NONE SEEN
Yeast Wet Prep HPF POC: NONE SEEN

## 2019-06-17 LAB — COMPREHENSIVE METABOLIC PANEL
ALT: 12 U/L (ref 0–44)
AST: 19 U/L (ref 15–41)
Albumin: 3.8 g/dL (ref 3.5–5.0)
Alkaline Phosphatase: 46 U/L (ref 38–126)
Anion gap: 8 (ref 5–15)
BUN: 7 mg/dL (ref 6–20)
CO2: 25 mmol/L (ref 22–32)
Calcium: 8.7 mg/dL — ABNORMAL LOW (ref 8.9–10.3)
Chloride: 105 mmol/L (ref 98–111)
Creatinine, Ser: 0.71 mg/dL (ref 0.44–1.00)
GFR calc Af Amer: >60 mL/min (ref >60)
GFR calc non Af Amer: >60 mL/min (ref >60)
Glucose, Bld: 86 mg/dL (ref 70–99)
Potassium: 4.1 mmol/L (ref 3.5–5.1)
Sodium: 138 mmol/L (ref 135–145)
Total Bilirubin: 0.5 mg/dL (ref 0.3–1.2)
Total Protein: 6.8 g/dL (ref 6.5–8.1)

## 2019-06-17 LAB — POCT URINALYSIS DIP (DEVICE)
Bilirubin Urine: NEGATIVE
Glucose, UA: NEGATIVE mg/dL
Ketones, ur: NEGATIVE mg/dL
Leukocytes,Ua: NEGATIVE
Nitrite: NEGATIVE
Protein, ur: NEGATIVE mg/dL
Specific Gravity, Urine: 1.025 (ref 1.005–1.030)
Urobilinogen, UA: 0.2 mg/dL (ref 0.0–1.0)
pH: 7 (ref 5.0–8.0)

## 2019-06-17 LAB — HIV ANTIBODY (ROUTINE TESTING W REFLEX): HIV Screen 4th Generation wRfx: NONREACTIVE

## 2019-06-17 LAB — LIPASE, BLOOD: Lipase: 21 U/L (ref 11–51)

## 2019-06-17 MED ORDER — IOHEXOL 300 MG/ML  SOLN
100.0000 mL | Freq: Once | INTRAMUSCULAR | Status: AC | PRN
  Administered 2019-06-17: 18:00:00 100 mL via INTRAVENOUS

## 2019-06-17 MED ORDER — MORPHINE SULFATE (PF) 4 MG/ML IV SOLN
4.0000 mg | Freq: Once | INTRAVENOUS | Status: AC
  Administered 2019-06-17: 14:00:00 4 mg via INTRAVENOUS
  Filled 2019-06-17: qty 1

## 2019-06-17 MED ORDER — METRONIDAZOLE 500 MG PO TABS
500.0000 mg | ORAL_TABLET | Freq: Once | ORAL | Status: AC
  Administered 2019-06-17: 17:00:00 500 mg via ORAL
  Filled 2019-06-17: qty 1

## 2019-06-17 MED ORDER — LIDOCAINE HCL (PF) 1 % IJ SOLN
INTRAMUSCULAR | Status: AC
  Administered 2019-06-17: 17:00:00 5 mL
  Filled 2019-06-17: qty 5

## 2019-06-17 MED ORDER — CEFTRIAXONE SODIUM 250 MG IJ SOLR
250.0000 mg | Freq: Once | INTRAMUSCULAR | Status: AC
  Administered 2019-06-17: 17:00:00 250 mg via INTRAMUSCULAR
  Filled 2019-06-17: qty 250

## 2019-06-17 MED ORDER — DOXYCYCLINE HYCLATE 100 MG PO CAPS: 100.0000 mg | ORAL_CAPSULE | Freq: Two times a day (BID) | ORAL | 0 refills | Status: AC

## 2019-06-17 MED ORDER — METRONIDAZOLE 500 MG PO TABS: 500.0000 mg | ORAL_TABLET | Freq: Two times a day (BID) | ORAL | 0 refills | Status: AC

## 2019-06-17 MED ORDER — DIPHENHYDRAMINE HCL 25 MG PO CAPS
25.0000 mg | ORAL_CAPSULE | Freq: Once | ORAL | Status: AC
  Administered 2019-06-17: 17:00:00 25 mg via ORAL
  Filled 2019-06-17: qty 1

## 2019-06-17 MED ORDER — SODIUM CHLORIDE 0.9 % IV BOLUS
1000.0000 mL | Freq: Once | INTRAVENOUS | Status: AC
  Administered 2019-06-17: 14:00:00 1000 mL via INTRAVENOUS

## 2019-06-17 MED ORDER — HYDROCODONE-ACETAMINOPHEN 5-325 MG PO TABS: 2.0000 | ORAL_TABLET | ORAL | 0 refills | Status: DC | PRN

## 2019-06-17 MED ORDER — DOXYCYCLINE HYCLATE 100 MG PO TABS
100.0000 mg | ORAL_TABLET | Freq: Once | ORAL | Status: AC
  Administered 2019-06-17: 100 mg via ORAL
  Filled 2019-06-17 (×2): qty 1

## 2019-06-17 MED ORDER — KETOROLAC TROMETHAMINE 30 MG/ML IJ SOLN
30.0000 mg | Freq: Once | INTRAMUSCULAR | Status: AC
  Administered 2019-06-17: 14:00:00 30 mg via INTRAVENOUS
  Filled 2019-06-17: qty 1

## 2019-06-17 NOTE — Discharge Instructions (Addendum)
Take the antibiotics as prescribed.  I have written you for a short course of pain medicine.  Do not drive or operate heavy machinery while taking this medication.  Medication also may become addictive.  If you have new, worsening pain, pain unable to be controlled with the medication, severe vomiting, worsening pain to one area please seek reevaluation.

## 2019-06-17 NOTE — Telephone Encounter (Signed)
Pt called in c/o left lower abd pain that started suddenly when she sat up in bed this morning.  It's shooting pains.   She is late starting her period this month.  I called into Primary Care at [facility] and all of their providers are booked today.   I have referred her to the urgent care/ED.   She is going to the [facility].   Reason for Disposition . [1] MILD-MODERATE pain AND [2] constant AND [3] present > 2 hours  Answer Assessment - Initial Assessment Questions 1. LOCATION: "Where does it hurt?"      My lower left side in my abd hurts 2. RADIATION: "Does the pain shoot anywhere else?" (e.g., chest, back)     No 3. ONSET: "When did the pain begin?" (e.g., minutes, hours or days ago)      Wednesday night I had pain in my back.   It's time for my period.   Yesterday no pain. This morning as soon as I got up it felt like someone is stabbing me. 4. SUDDEN: "Gradual or sudden onset?"     Suddenly this morning when I sat up in bed. 5. PATTERN "Does the pain come and go, or is it constant?"    - If constant: "Is it getting better, staying the same, or worsening?"      (Note: Constant means the pain never goes away completely; most serious pain is constant and it progresses)     - If intermittent: "How long does it last?" "Do you have pain now?"     (Note: Intermittent means the pain goes away completely between bouts)     Yes 6. SEVERITY: "How bad is the pain?"  (e.g., Scale 1-10; mild, moderate, or severe)   - MILD (1-3): doesn't interfere with normal activities, abdomen soft and not tender to touch    - MODERATE (4-7): interferes with normal activities or awakens from sleep, tender to touch    - SEVERE (8-10): excruciating pain, doubled over, unable to do any normal activities      7 with sharp pains.    The sharp pains come and go.    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    This pain happens sometimes before I start my period but this morning it was especially  bad. 7. RECURRENT SYMPTOM: "Have you ever had this type of abdominal pain before?" If so, ask: "When was the last time?" and "What happened that time?"      Yes before my periods but not this bad. 8. CAUSE: "What do you think is causing the abdominal pain?"     Maybe a cyst.   I've had one before on my ovary. 9. RELIEVING/AGGRAVATING FACTORS: "What makes it better or worse?" (e.g., movement, antacids, bowel movement)     I've not done anything other than use a heating pad. 10. OTHER SYMPTOMS: "Has there been any vomiting, diarrhea, constipation, or urine problems?"       I've had some diarrhea for the last couple of days.   I have discomfort but not pain before I have a BM.   I've only had a few episodes of diarrhea.   No fever. 11. PREGNANCY: "Is there any chance you are pregnant?" "When was your last menstrual period?"       Yes  I'm not on birth control.   No intercourse since my last period.    My period has not come this month.  I have my period regularly so this is unusual.  Protocols used: ABDOMINAL PAIN - [facility]

## 2019-06-17 NOTE — ED Provider Notes (Signed)
[facility]    CSN: [id number] Arrival date & time: 06/17/19  [id number]      History   Chief Complaint Chief Complaint  Patient presents with  . Abdominal Pain    HPI Leah [name] is a 28 y.o. female.   HPI  Patient is here with left lower quadrant pain.  She states that the last time she had pain this severe she ended up needing surgery for a large dermoid cyst in her ovary.  She still has the ovary, all of the sisters removed.  She states this is been going on for 3 days.  No fever chills.  No nausea vomiting diarrhea.  Decreased p.o. intake but she is drinking fluids.  She has not taken any medicine for pain.  Her periods are regular.  She is certain she is not pregnant.  She declines need for STD testing.  Regular bowels.  Last bowel movement yesterday.  Past Medical History:  Diagnosis Date  . Asthma   . Herpes genitalia   . Ovarian cyst     Patient Active Problem List   Diagnosis Date Noted  . Oral herpes 03/23/2019  . Well controlled intermittent asthma 03/23/2019  . Dermoid cyst of left ovary 11/23/2013    Past Surgical History:  Procedure Laterality Date  . EYE MUSCLE SURGERY    . OVARIAN CYST REMOVAL      OB History    Gravida  0   Para      Term      Preterm      AB      Living        SAB      TAB      Ectopic      Multiple      Live Births               Home Medications    Prior to Admission medications   Medication Sig Start Date End Date Taking? Authorizing Provider  albuterol (VENTOLIN HFA) 108 (90 Base) MCG/ACT inhaler Inhale 2 puffs into the lungs every 6 (six) hours as needed for wheezing or shortness of breath. 03/23/19   Forrest Moron, MD  doxycycline (VIBRAMYCIN) 100 MG capsule Take 1 capsule (100 mg total) by mouth 2 (two) times daily for 14 days. 06/17/19 07/01/19  Henderly, Britni A, PA-C  escitalopram (LEXAPRO) 20 MG tablet TAKE 1 TABLET BY MOUTH  DAILY Patient taking differently: Take 20 mg by mouth  daily.  04/19/19   Forrest Moron, MD  HYDROcodone-acetaminophen (NORCO/VICODIN) 5-325 MG tablet Take 2 tablets by mouth every 4 (four) hours as needed. 06/17/19   Henderly, Britni A, PA-C  metroNIDAZOLE (FLAGYL) 500 MG tablet Take 1 tablet (500 mg total) by mouth 2 (two) times daily for 14 days. 06/17/19 07/01/19  Henderly, Britni A, PA-C  valACYclovir (VALTREX) 500 MG tablet Take 1 tablet (500 mg total) by mouth daily. 03/23/19   Forrest Moron, MD    Family History Family History  Problem Relation Age of Onset  . Asthma Father   . Hyperlipidemia Father   . Asthma Brother   . Cancer Mother        lung  . Asthma Sister   . Asthma Paternal Aunt   . Asthma Paternal Grandmother     Social History Social History   Tobacco Use  . Smoking status: Never Smoker  . Smokeless tobacco: Never Used  Substance Use Topics  . Alcohol use: No  Alcohol/week: 0.0 standard drinks    Comment: ETOh once a week, no binge drinker  . Drug use: No     Allergies   Penicillins   Review of Systems Review of Systems  Constitutional: Negative for chills and fever.  HENT: Negative for ear pain and sore throat.   Eyes: Negative for pain and visual disturbance.  Respiratory: Negative for cough and shortness of breath.   Cardiovascular: Negative for chest pain and palpitations.  Gastrointestinal: Positive for abdominal pain. Negative for blood in stool, constipation, diarrhea, nausea and vomiting.  Genitourinary: Negative for dysuria, flank pain, frequency, hematuria, menstrual problem, pelvic pain, vaginal bleeding and vaginal discharge.  Musculoskeletal: Negative for arthralgias and back pain.  Skin: Negative for color change and rash.  Neurological: Negative for seizures and syncope.  All other systems reviewed and are negative.    Physical Exam Triage Vital Signs ED Triage Vitals  Enc Vitals Group     BP 06/17/19 0939 110/62     Pulse Rate 06/17/19 0939 65     Resp 06/17/19 0939 15      Temp 06/17/19 0939 98.6 F (37 C)     Temp Source 06/17/19 0939 Oral     SpO2 06/17/19 0939 100 %     Weight --      Height --      Head Circumference --      Peak Flow --      Pain Score 06/17/19 0936 6     Pain Loc --      Pain Edu? --      Excl. in [place]? --    No data found.  Updated Vital Signs BP 110/62 (BP Location: Left Arm)   Pulse 65   Temp 98.6 F (37 C) (Oral)   Resp 15   SpO2 100%   Physical Exam Constitutional:      General: She is not in acute distress.    Appearance: She is well-developed and normal weight.     Comments: Patient is doubled over in the exam chair rocking back and forth holding her abdomen  HENT:     Head: Normocephalic and atraumatic.  Eyes:     Conjunctiva/sclera: Conjunctivae normal.     Pupils: Pupils are equal, round, and reactive to light.  Neck:     Musculoskeletal: Normal range of motion.  Cardiovascular:     Rate and Rhythm: Normal rate and regular rhythm.  Pulmonary:     Effort: Pulmonary effort is normal. No respiratory distress.     Breath sounds: Normal breath sounds.  Abdominal:     General: Bowel sounds are normal. There is no distension.     Palpations: Abdomen is soft. There is no hepatomegaly or splenomegaly.     Tenderness: There is abdominal tenderness in the left lower quadrant. There is guarding and rebound.     Comments: Patient has mild guarding to palpation of the left lower quadrant.  No palpable mass.  With rebound testing patient grimaces and starts to cry.  Musculoskeletal: Normal range of motion.  Skin:    General: Skin is warm and dry.  Neurological:     Mental Status: She is alert.      UC Treatments / Results  Labs (all labs ordered are listed, but only abnormal results are displayed) Labs Reviewed  POCT URINALYSIS DIP (DEVICE) - Abnormal; Notable for the following components:      Result Value   Hgb urine dipstick MODERATE (*)    All  other components within normal limits  POCT PREGNANCY, URINE     EKG   Procedures (including critical care time)  Medications Ordered in UC Medications - No data to display  Initial Impression / Assessment and Plan / UC Course  I have reviewed the triage vital signs and the nursing notes.  Pertinent labs & imaging results that were available during my care of the patient were reviewed by me and considered in my medical decision making (see chart for details).     I explained to the patient the limits of doing abdominal pain evaluations at the urgent care center.  I told her is happy to get a CBC UA and plain abdominal film.  If all of these are normal I could send her home with pain medication, to go to the ER if she worsens.  Patient feels like she needs imaging, at the very least an ultrasound to check her ovary.  She was therefore treated transfer to the emergency department for care Final Clinical Impressions(s) / UC Diagnoses   Final diagnoses:  Left lower quadrant abdominal pain     Discharge Instructions     GO TO ER   ED Prescriptions    None     PDMP not reviewed this encounter.   Raylene Everts, MD 06/17/19 2104

## 2019-06-17 NOTE — Discharge Instructions (Signed)
GO TO ER °

## 2019-06-17 NOTE — ED Notes (Signed)
Patient is being discharged from the Urgent [facility] and sent to the Emergency Department accompanied by staff. Per SN, patient is stable but in need of higher level of care due to eval abd pain. Patient is aware and verbalizes understanding of plan of care.  Vitals:   06/17/19 0939  BP: 110/62  Pulse: 65  Resp: 15  Temp: 98.6 F (37 C)  SpO2: 100%

## 2019-06-17 NOTE — ED Triage Notes (Signed)
Pt report having abdominal pain x 3 days, pt denies fever, diarrhea, nausea.

## 2019-06-17 NOTE — ED Notes (Addendum)
Entered PT's room to check on/assist patient to get dressed, and in the wheel chair to leave. PT was crying upon this tech entering room, tech tried to comfort PT, PT reports "I was just given these papers and told to go pick up my medicine" the doctor never told me what was wrong with me, and never gave me the results of my scan. PT also states "the provider stated she should have PTs results back soon, and would be in to go over the results with PT.Marland Kitchen and the provider never returned." this Tech continued to comfort PT, and instructed PT that this tech would go let the nurse know that PT never received results and inform nurse of PTs concerns.

## 2019-06-17 NOTE — ED Provider Notes (Signed)
[place] EMERGENCY DEPARTMENT Provider Note   CSN: [id number] Arrival date & time: 06/17/19  1048    History   Chief Complaint Chief Complaint  Patient presents with   Abdominal Pain   HPI Leah [name] is a 28 y.o. female with past medical history significant for asthma, herpes who presents for evaluation of left lower quadrant pain.  Sent from urgent care to rule out ovarian torsion.  Patient states she has had intermittent left lower quadrant/left pelvic pain onset Wednesday.  Pain worse with movement.  Does not take anything for pain.  States pain does feel similar with her prior ovarian cyst.  Denies fever, chills, nausea, vomiting, chest pain, shortness of breath, vaginal discharge, dysuria, diarrhea, constipation.  Last menstrual cycle in August.  States she is sexually active and does not use protection.  Rates her current pain a 10/10. Pain does not radiate into or from flank. No hx of renal stones.  History obtained from patient and past medical records.  No interpreter is used.     HPI  Past Medical History:  Diagnosis Date   Asthma    Herpes genitalia    Ovarian cyst     Patient Active Problem List   Diagnosis Date Noted   Oral herpes 03/23/2019   Well controlled intermittent asthma 03/23/2019   Dermoid cyst of left ovary 11/23/2013    Past Surgical History:  Procedure Laterality Date   EYE MUSCLE SURGERY     OVARIAN CYST REMOVAL       OB History    Gravida  0   Para      Term      Preterm      AB      Living        SAB      TAB      Ectopic      Multiple      Live Births               Home Medications    Prior to Admission medications   Medication Sig Start Date End Date Taking? Authorizing Provider  albuterol (VENTOLIN HFA) 108 (90 Base) MCG/ACT inhaler Inhale 2 puffs into the lungs every 6 (six) hours as needed for wheezing or shortness of breath. 03/23/19  Yes Stallings, Zoe A, MD  escitalopram  (LEXAPRO) 20 MG tablet TAKE 1 TABLET BY MOUTH  DAILY Patient taking differently: Take 20 mg by mouth daily.  04/19/19  Yes Forrest Moron, MD  valACYclovir (VALTREX) 500 MG tablet Take 1 tablet (500 mg total) by mouth daily. 03/23/19  Yes Forrest Moron, MD  doxycycline (VIBRAMYCIN) 100 MG capsule Take 1 capsule (100 mg total) by mouth 2 (two) times daily for 14 days. 06/17/19 07/01/19  Haruko Mersch A, PA-C  HYDROcodone-acetaminophen (NORCO/VICODIN) 5-325 MG tablet Take 2 tablets by mouth every 4 (four) hours as needed. 06/17/19   Faye Sanfilippo A, PA-C  metroNIDAZOLE (FLAGYL) 500 MG tablet Take 1 tablet (500 mg total) by mouth 2 (two) times daily for 14 days. 06/17/19 07/01/19  Rand Etchison A, PA-C    Family History Family History  Problem Relation Age of Onset   Asthma Father    Hyperlipidemia Father    Asthma Brother    Cancer Mother        lung   Asthma Sister    Asthma Paternal Aunt    Asthma Paternal Grandmother     Social History Social History   Tobacco  Use   Smoking status: Never Smoker   Smokeless tobacco: Never Used  Substance Use Topics   Alcohol use: No    Alcohol/week: 0.0 standard drinks    Comment: ETOh once a week, no binge drinker   Drug use: No     Allergies   Penicillins   Review of Systems Review of Systems  Constitutional: Negative.   HENT: Negative.   Respiratory: Negative.   Cardiovascular: Negative.   Gastrointestinal: Positive for abdominal pain. Negative for abdominal distention, anal bleeding, blood in stool, constipation, diarrhea, nausea, rectal pain and vomiting.  Genitourinary: Positive for pelvic pain. Negative for decreased urine volume, difficulty urinating, dysuria, flank pain, frequency, hematuria, menstrual problem, urgency, vaginal bleeding, vaginal discharge and vaginal pain.  Musculoskeletal: Negative.   Skin: Negative.   Neurological: Negative.   All other systems reviewed and are negative.    Physical  Exam Updated Vital Signs BP 94/73 (BP Location: Left Arm)    Pulse 82    Temp 98.7 F (37.1 C) (Oral)    Resp 18    LMP 05/10/2019    SpO2 100%   Physical Exam Vitals signs and nursing note reviewed.  Constitutional:      General: She is not in acute distress.    Appearance: She is well-developed. She is not ill-appearing, toxic-appearing or diaphoretic.  HENT:     Head: Normocephalic and atraumatic.     Mouth/Throat:     Mouth: Mucous membranes are moist.  Eyes:     Pupils: Pupils are equal, round, and reactive to light.  Neck:     Musculoskeletal: Normal range of motion.  Cardiovascular:     Rate and Rhythm: Normal rate.     Heart sounds: Normal heart sounds.  Pulmonary:     Effort: Pulmonary effort is normal. No respiratory distress.     Breath sounds: Normal breath sounds.  Abdominal:     General: Bowel sounds are normal. There is no distension.     Palpations: Abdomen is soft.     Tenderness: There is abdominal tenderness in the left lower quadrant. There is no right CVA tenderness, left CVA tenderness, guarding or rebound. Negative signs include McBurney's sign, psoas sign and obturator sign.     Hernia: No hernia is present.       Comments: Tenderness to lower left abdomen without rebound or guarding.  Genitourinary:    Comments: Normal appearing external female genitalia without rashes or lesions, normal vaginal epithelium. Normal appearing cervix with frothy white, light yellow discharge. No cervical petechiae. Cervical os is closed. There is no bleeding noted at the os. No Odor. Bimanual: CMT on exam. No palpable adnexal masses or tenderness. Uterus midline and not fixed. Rectovaginal exam was deferred.  No cystocele or rectocele noted. No pelvic lymphadenopathy noted. Wet prep was obtained.  Cultures for gonorrhea and chlamydia collected. Exam performed with chaperone in room. Musculoskeletal: Normal range of motion.     Comments: Moves all 4 extremities without  difficulty.  Skin:    General: Skin is warm and dry.     Comments: No edema, erythema, ecchymosis or warmth.  Neurological:     Mental Status: She is alert.      ED Treatments / Results  Labs (all labs ordered are listed, but only abnormal results are displayed) Labs Reviewed  WET PREP, GENITAL - Abnormal; Notable for the following components:      Result Value   Clue Cells Wet Prep HPF POC PRESENT (*)  WBC, Wet Prep HPF POC MANY (*)    All other components within normal limits  COMPREHENSIVE METABOLIC PANEL - Abnormal; Notable for the following components:   Calcium 8.7 (*)    All other components within normal limits  URINALYSIS, ROUTINE W REFLEX MICROSCOPIC - Abnormal; Notable for the following components:   Hgb urine dipstick SMALL (*)    All other components within normal limits  LIPASE, BLOOD  CBC  HIV ANTIBODY (ROUTINE TESTING W REFLEX)  RPR  HIV4GL SAVE TUBE  I-STAT BETA HCG BLOOD, ED ([facility], [facility], [facility] ONLY)  GC/CHLAMYDIA PROBE AMP ([place]) NOT AT [facility]    EKG None  Radiology US Transvaginal Non-ob  Result Date: 06/17/2019 CLINICAL DATA:  Left lower abdominal pain EXAM: TRANSABDOMINAL AND TRANSVAGINAL ULTRASOUND OF PELVIS DOPPLER ULTRASOUND OF OVARIES TECHNIQUE: Both transabdominal and transvaginal ultrasound examinations of the pelvis were performed. Transabdominal technique was performed for global imaging of the pelvis including uterus, ovaries, adnexal regions, and pelvic cul-de-sac. It was necessary to proceed with endovaginal exam following the transabdominal exam to visualize the uterus, endometrium, ovaries and adnexa. Color and duplex Doppler ultrasound was utilized to evaluate blood flow to the ovaries. COMPARISON:  12/24/2015 FINDINGS: Uterus Measurements: 6.9 x 3.9 x 4.7 cm = volume: 68 mL. No fibroids or other mass visualized. Endometrium Thickness: 12 mm in thickness.  No focal abnormality visualized. Right ovary Measurements: 3.5 x 2.1 x 3.0 cm = volume:  11.5 mL. Normal appearance/no adnexal mass. Left ovary Measurements: 3.0 x 1.5 x 1.9 cm = volume: 4.7 mL. Normal appearance/no adnexal mass. Pulsed Doppler evaluation of both ovaries demonstrates normal low-resistance arterial and venous waveforms. Other findings No abnormal free fluid. IMPRESSION: Unremarkable pelvic ultrasound. Electronically Signed   By: Rolm Baptise M.D.   On: 06/17/2019 13:36   US Pelvis Complete  Result Date: 06/17/2019 CLINICAL DATA:  Left lower abdominal pain EXAM: TRANSABDOMINAL AND TRANSVAGINAL ULTRASOUND OF PELVIS DOPPLER ULTRASOUND OF OVARIES TECHNIQUE: Both transabdominal and transvaginal ultrasound examinations of the pelvis were performed. Transabdominal technique was performed for global imaging of the pelvis including uterus, ovaries, adnexal regions, and pelvic cul-de-sac. It was necessary to proceed with endovaginal exam following the transabdominal exam to visualize the uterus, endometrium, ovaries and adnexa. Color and duplex Doppler ultrasound was utilized to evaluate blood flow to the ovaries. COMPARISON:  12/24/2015 FINDINGS: Uterus Measurements: 6.9 x 3.9 x 4.7 cm = volume: 68 mL. No fibroids or other mass visualized. Endometrium Thickness: 12 mm in thickness.  No focal abnormality visualized. Right ovary Measurements: 3.5 x 2.1 x 3.0 cm = volume: 11.5 mL. Normal appearance/no adnexal mass. Left ovary Measurements: 3.0 x 1.5 x 1.9 cm = volume: 4.7 mL. Normal appearance/no adnexal mass. Pulsed Doppler evaluation of both ovaries demonstrates normal low-resistance arterial and venous waveforms. Other findings No abnormal free fluid. IMPRESSION: Unremarkable pelvic ultrasound. Electronically Signed   By: Rolm Baptise M.D.   On: 06/17/2019 13:36   Ct Abdomen Pelvis W Contrast  Result Date: 06/17/2019 CLINICAL DATA:  Left lower quadrant pain, onset last night EXAM: CT ABDOMEN AND PELVIS WITH CONTRAST TECHNIQUE: Multidetector CT imaging of the abdomen and pelvis was  performed using the standard protocol following bolus administration of intravenous contrast. CONTRAST:  126mL OMNIPAQUE IOHEXOL 300 MG/ML  SOLN COMPARISON:  CT Jan 19, 2014 FINDINGS: Lower chest: Lung bases are clear. Normal heart size. No pericardial effusion. Hepatobiliary: Mild fatty infiltration along falciform ligament. No focal liver abnormality is seen. No gallstones, gallbladder wall thickening, or  biliary dilatation. Pancreas: Unremarkable. No pancreatic ductal dilatation or surrounding inflammatory changes. Spleen: Normal in size without focal abnormality. Adrenals/Urinary Tract: Normal adrenal glands. Fluid attenuation upper pole renal sinus cyst measuring 1 cm (Bosniak 1). Kidneys are otherwise unremarkable, without renal calculi, suspicious lesion, or hydronephrosis. Bladder is unremarkable. Stomach/Bowel: Distal esophagus, stomach and duodenal sweep are unremarkable. No small bowel wall thickening or dilatation. No evidence of obstruction. A normal appendix is visualized. No colonic dilatation or wall thickening. Vascular/Lymphatic: The aorta is normal caliber. No suspicious or enlarged lymph nodes in the included lymphatic chains. Reproductive: Interval removal of the previously seen left ovarian dermoid retroflexed uterus. Uterine hyper attenuation may be related to phase of contrast timing. Collapsing follicle in the right ovary. No concerning adnexal lesions. Other: No abdominopelvic free fluid or free gas. No bowel containing hernias. Small fat containing umbilical hernia. Musculoskeletal: No acute osseous abnormality or suspicious osseous lesion. IMPRESSION: No acute intra-abdominal process. Retroflexed uterus. Interval removal of the previously seen left ovarian dermoid. Electronically Signed   By: Lovena Le M.D.   On: 06/17/2019 19:10   [place] Art/ven Flow Abd Pelv Doppler  Result Date: 06/17/2019 CLINICAL DATA:  Left lower abdominal pain EXAM: TRANSABDOMINAL AND TRANSVAGINAL ULTRASOUND OF  PELVIS DOPPLER ULTRASOUND OF OVARIES TECHNIQUE: Both transabdominal and transvaginal ultrasound examinations of the pelvis were performed. Transabdominal technique was performed for global imaging of the pelvis including uterus, ovaries, adnexal regions, and pelvic cul-de-sac. It was necessary to proceed with endovaginal exam following the transabdominal exam to visualize the uterus, endometrium, ovaries and adnexa. Color and duplex Doppler ultrasound was utilized to evaluate blood flow to the ovaries. COMPARISON:  12/24/2015 FINDINGS: Uterus Measurements: 6.9 x 3.9 x 4.7 cm = volume: 68 mL. No fibroids or other mass visualized. Endometrium Thickness: 12 mm in thickness.  No focal abnormality visualized. Right ovary Measurements: 3.5 x 2.1 x 3.0 cm = volume: 11.5 mL. Normal appearance/no adnexal mass. Left ovary Measurements: 3.0 x 1.5 x 1.9 cm = volume: 4.7 mL. Normal appearance/no adnexal mass. Pulsed Doppler evaluation of both ovaries demonstrates normal low-resistance arterial and venous waveforms. Other findings No abnormal free fluid. IMPRESSION: Unremarkable pelvic ultrasound. Electronically Signed   By: Rolm Baptise M.D.   On: 06/17/2019 13:36    Procedures Procedures (including critical care time)  Medications Ordered in ED Medications  sodium chloride 0.9 % bolus 1,000 mL (0 mLs Intravenous Stopped 06/17/19 1613)  morphine 4 MG/ML injection 4 mg (4 mg Intravenous Given 06/17/19 1419)  ketorolac (TORADOL) 30 MG/ML injection 30 mg (30 mg Intravenous Given 06/17/19 1419)  cefTRIAXone (ROCEPHIN) injection 250 mg (250 mg Intramuscular Given 06/17/19 1648)  diphenhydrAMINE (BENADRYL) capsule 25 mg (25 mg Oral Given 06/17/19 1649)  metroNIDAZOLE (FLAGYL) tablet 500 mg (500 mg Oral Given 06/17/19 1649)  doxycycline (VIBRA-TABS) tablet 100 mg (100 mg Oral Given 06/17/19 1653)  lidocaine (PF) (XYLOCAINE) 1 % injection (5 mLs  Given 06/17/19 1647)  iohexol (OMNIPAQUE) 300 MG/ML solution 100 mL (100 mLs  Intravenous Contrast Given 06/17/19 1819)   Initial Impression / Assessment and Plan / ED Course  I have reviewed the triage vital signs and the nursing notes.  Pertinent labs & imaging results that were available during my care of the patient were reviewed by me and considered in my medical decision making (see chart for details).  28 year old female appears otherwise well presents for evaluation of left lower quadrant abdominal pain.  Began on Wednesday, 2 days PTA however worse over the last 24  hours.  Sent from urgent care to rule out ovarian torsion.  Labs and imaging obtained prior to my evaluation.  CBC without leukocytosis, hemoglobin [id number] Metabolic panel without electrolyte, renal or liver normality Lipase 21 Urinalysis negative for infection  Ultrasound negative for torsion or acute pelvic pathology.  On my evaluation abdomen tender to left lower quadrant without rebound or guarding.  Pain worse with movement.  Negative CVA tap bilaterally.  Arrived pain management, IV fluids.  Will perform pelvic exam as well as CT scan.  1610: Patient with frequent improvement in pain on reevaluation.  Pain only rated a 3/10, worse with movement.  GU exam with frothy white/light yellow discharge on exam.  She does have cervical motion tenderness.  1750: Patient reevaluated.  Pain rated 2/10.  She is sleeping on reevaluation appears comfortable.  She is pending CT scan.  Consulted with radiologist.  Patient has 4 patients in front of her.  Discussed with patient.   1850: Pain controlled.  Patient requesting food and drink.  Pending CT reads.  High suspicion for PID as cause of pain.  She does have mild blood in her urine.  CT scan to assess for stone however urinalysis negative for infection.   CT without acute findings  Patient is nontoxic, nonseptic appearing, in no apparent distress.  Patient's pain and other symptoms adequately managed in emergency department.  Fluid bolus given.  Labs,  imaging and vitals reviewed.  Patient does not meet the SIRS or Sepsis criteria.  On repeat exam patient does not have a surgical abdomin and there are no peritoneal signs.  No indication of appendicitis, bowel obstruction, bowel perforation, cholecystitis, diverticulitis, TOA, torsion or ectopic pregnancy.    The patient has been appropriately medically screened and/or stabilized in the ED. I have low suspicion for any other emergent medical condition which would require further screening, evaluation or treatment in the ED or require inpatient management.  Patient is hemodynamically stable and in no acute distress.  Patient able to ambulate in department prior to ED.  Evaluation does not show acute pathology that would require ongoing or additional emergent interventions while in the emergency department or further inpatient treatment.  I have discussed the diagnosis with the patient and answered all questions.  Pain is been managed while in the emergency department and patient has no further complaints prior to discharge.  Patient is comfortable with plan discussed in room and is stable for discharge at this time.  I have discussed strict return precautions for returning to the emergency department.  Patient was encouraged to follow-up with PCP/specialist refer to at discharge.     Final Clinical Impressions(s) / ED Diagnoses   Final diagnoses:  LLQ pain  Cervicitis    ED Discharge Orders         Ordered    doxycycline (VIBRAMYCIN) 100 MG capsule  2 times daily     06/17/19 1845    metroNIDAZOLE (FLAGYL) 500 MG tablet  2 times daily     06/17/19 1845    HYDROcodone-acetaminophen (NORCO/VICODIN) 5-325 MG tablet  Every 4 hours PRN     06/17/19 1845           Tavarius Grewe A, PA-C 06/17/19 [place], Dan, DO 06/20/19 1747

## 2019-06-17 NOTE — ED Triage Notes (Signed)
Pt to ER for evaluation of left lower quadrant abdominal pain onset last night, patient is in the floor writhing in pain, sent here from [facility] for rule out ovarian torsion. Last cycle late August. Reports pain intermittently gets worse and gets better. No fever, diarrhea, nausea, or vomiting.

## 2019-06-18 LAB — RPR: RPR Ser Ql: NONREACTIVE

## 2019-07-13 ENCOUNTER — Ambulatory Visit: Payer: 59 | Admitting: Family Medicine

## 2019-07-13 ENCOUNTER — Encounter: Payer: Self-pay | Admitting: Family Medicine

## 2019-07-13 ENCOUNTER — Other Ambulatory Visit: Payer: Self-pay

## 2019-07-13 VITALS — BP 101/65 | HR 68 | Temp 98.5°F | Ht 66.0 in | Wt 151.0 lb

## 2019-07-13 DIAGNOSIS — R1032 Left lower quadrant pain: Secondary | ICD-10-CM

## 2019-07-13 LAB — POCT WET + KOH PREP
Trich by wet prep: ABSENT
Yeast by KOH: ABSENT
Yeast by wet prep: ABSENT

## 2019-07-13 NOTE — Progress Notes (Signed)
Established Patient Office Visit  Subjective:  Patient ID: Leah [name], female    DOB: 16-Jul-1991  Age: 28 y.o. MRN: [id number]  CC:  Chief Complaint  Patient presents with  . Hospitalization Follow-up    LLQ-pt stated still having sharp, cramping    HPI Leah [name] presents for   Patient reports that she was seen in the ER on 06/17/2019 for LLQ pain She reports that she was evaluated for pelvic pain She states that she was given Rocephin 250mg  IM and Doxycycline 100mg  BID for 14 days.  She reports that the day on 06/18/2019 she had vaginal bleeding for for the next 2 days She reports that she had a negative pregnancy test and she had a quantitative hcg that was negative as well.   She reports that she had  She took the doxycycline and flagyl 100mg  bid for 14 days She reports that she kept vomiting up her antibiotics initially  She denies fevers or chills  She denies vaginal discharge She reports that she had a yeast infection after the antibiotic and treated it over the counter  Patient's last menstrual period was 06/18/2019.   Past Medical History:  Diagnosis Date  . Asthma   . Herpes genitalia   . Ovarian cyst     Past Surgical History:  Procedure Laterality Date  . EYE MUSCLE SURGERY    . OVARIAN CYST REMOVAL      Family History  Problem Relation Age of Onset  . Asthma Father   . Hyperlipidemia Father   . Asthma Brother   . Cancer Mother        lung  . Asthma Sister   . Asthma Paternal Aunt   . Asthma Paternal Grandmother     Social History   Socioeconomic History  . Marital status: Single    Spouse name: Not on file  . Number of children: Not on file  . Years of education: Not on file  . Highest education level: Not on file  Occupational History  . Not on file  Social Needs  . Financial resource strain: Not on file  . Food insecurity    Worry: Not on file    Inability: Not on file  . Transportation needs    Medical: Not on file    Non-medical: Not on file  Tobacco Use  . Smoking status: Never Smoker  . Smokeless tobacco: Never Used  Substance and Sexual Activity  . Alcohol use: No    Alcohol/week: 0.0 standard drinks    Frequency: Never    Comment: ETOh once a week, no binge drinker  . Drug use: No  . Sexual activity: Yes    Partners: Female, Female    Birth control/protection: Implant  Lifestyle  . Physical activity    Days per week: Not on file    Minutes per session: Not on file  . Stress: Not on file  Relationships  . Social Herbalist on phone: Not on file    Gets together: Not on file    Attends religious service: Not on file    Active member of club or organization: Not on file    Attends meetings of clubs or organizations: Not on file    Relationship status: Not on file  . Intimate partner violence    Fear of current or ex partner: Not on file    Emotionally abused: Not on file    Physically abused: Not on file    Forced  sexual activity: Not on file  Other Topics Concern  . Not on file  Social History Narrative   Single   Current student at [employer]    Outpatient Medications Prior to Visit  Medication Sig Dispense Refill  . albuterol (VENTOLIN HFA) 108 (90 Base) MCG/ACT inhaler Inhale 2 puffs into the lungs every 6 (six) hours as needed for wheezing or shortness of breath. 18 g 1  . escitalopram (LEXAPRO) 20 MG tablet TAKE 1 TABLET BY MOUTH  DAILY (Patient taking differently: Take 20 mg by mouth daily. ) 90 tablet 1  . valACYclovir (VALTREX) 500 MG tablet Take 1 tablet (500 mg total) by mouth daily. 90 tablet 3  . HYDROcodone-acetaminophen (NORCO/VICODIN) 5-325 MG tablet Take 2 tablets by mouth every 4 (four) hours as needed. 8 tablet 0   No facility-administered medications prior to visit.     Allergies  Allergen Reactions  . Penicillins Other (See Comments)    Family allergy  Has patient had a PCN reaction causing immediate rash, facial/tongue/throat swelling, SOB or  lightheadedness with hypotension: No Has patient had a PCN reaction causing severe rash involving mucus membranes or skin necrosis: No Has patient had a PCN reaction that required hospitalization No Has patient had a PCN reaction occurring within the last 10 years: No If all of the above answers are "NO", then may proceed with Cephalosporin use.    ROS Review of Systems See hpi   Objective:    Physical Exam  BP 101/65 (BP Location: Right Arm, Patient Position: Sitting, Cuff Size: Normal)   Pulse 68   Temp 98.5 F (36.9 C)   Ht 5\' 6"  (1.676 m)   Wt 151 lb (68.5 kg)   LMP 06/18/2019   SpO2 99%   BMI 24.37 kg/m  Wt Readings from Last 3 Encounters:  07/13/19 151 lb (68.5 kg)  03/23/19 158 lb 3.2 oz (71.8 kg)  02/10/19 159 lb 3.2 oz (72.2 kg)   Physical Exam  Constitutional: Oriented to person, place, and time. Appears well-developed and well-nourished.  HENT:  Head: Normocephalic and atraumatic.  Eyes: Conjunctivae and EOM are normal.  Cardiovascular: Normal rate, regular rhythm, normal heart sounds and intact distal pulses.  No murmur heard. Pulmonary/Chest: Effort normal and breath sounds normal. No stridor. No respiratory distress. Has no wheezes.  Neurological: Is alert and oriented to person, place, and time.  Skin: Skin is warm. Capillary refill takes less than 2 seconds.  Psychiatric: Has a normal mood and affect. Behavior is normal. Judgment and thought content normal.    Health Maintenance Due  Topic Date Due  . PAP-Cervical Cytology Screening  09/06/2014  . PAP SMEAR-Modifier  09/06/2014    There are no preventive care reminders to display for this patient.  Lab Results  Component Value Date   TSH 0.891 11/05/2018   Lab Results  Component Value Date   WBC 5.0 06/17/2019   HGB 13.0 06/17/2019   HCT 39.8 06/17/2019   MCV 90.7 06/17/2019   PLT 332 06/17/2019   Lab Results  Component Value Date   NA 138 06/17/2019   K 4.1 06/17/2019   CO2 25  06/17/2019   GLUCOSE 86 06/17/2019   BUN 7 06/17/2019   CREATININE 0.71 06/17/2019   BILITOT 0.5 06/17/2019   ALKPHOS 46 06/17/2019   AST 19 06/17/2019   ALT 12 06/17/2019   PROT 6.8 06/17/2019   ALBUMIN 3.8 06/17/2019   CALCIUM 8.7 (L) 06/17/2019   ANIONGAP 8 06/17/2019   Lab  Results  Component Value Date   CHOL 147 09/21/2017   Lab Results  Component Value Date   HDL 58 09/21/2017   Lab Results  Component Value Date   LDLCALC 79 09/21/2017   Lab Results  Component Value Date   TRIG 49 09/21/2017   Lab Results  Component Value Date   CHOLHDL 2.5 09/21/2017   No results found for: HGBA1C    Assessment & Plan:   Problem List Items Addressed This Visit    None    Visit Diagnoses    LLQ pain    -  Primary   Relevant Orders   POCT Wet + KOH Prep (Completed)     Advised her to finish the antibiotic Repeated wet prep Normal result Her infection is clearing Reviewed the entire ER encounter and provided explanations regarding the work up   No orders of the defined types were placed in this encounter.   Follow-up: No follow-ups on file.    Forrest Moron, MD

## 2019-09-28 ENCOUNTER — Other Ambulatory Visit: Payer: Self-pay | Admitting: Family Medicine

## 2019-11-09 ENCOUNTER — Ambulatory Visit: Payer: 59 | Admitting: Family Medicine

## 2019-11-14 ENCOUNTER — Other Ambulatory Visit: Payer: Self-pay

## 2019-11-14 ENCOUNTER — Ambulatory Visit (INDEPENDENT_AMBULATORY_CARE_PROVIDER_SITE_OTHER): Payer: 59 | Admitting: Family Medicine

## 2019-11-14 ENCOUNTER — Other Ambulatory Visit (HOSPITAL_COMMUNITY)
Admission: RE | Admit: 2019-11-14 | Discharge: 2019-11-14 | Disposition: A | Discharge status: Home / Self Care | Payer: 59 | Source: Ambulatory Visit | Attending: Family Medicine | Admitting: Family Medicine

## 2019-11-14 VITALS — BP 107/67 | HR 72 | Temp 97.9°F | Resp 15 | Ht 66.0 in | Wt 159.0 lb

## 2019-11-14 DIAGNOSIS — Z0001 Encounter for general adult medical examination with abnormal findings: Secondary | ICD-10-CM

## 2019-11-14 DIAGNOSIS — B002 Herpesviral gingivostomatitis and pharyngotonsillitis: Secondary | ICD-10-CM

## 2019-11-14 DIAGNOSIS — Z124 Encounter for screening for malignant neoplasm of cervix: Secondary | ICD-10-CM | POA: Insufficient documentation

## 2019-11-14 DIAGNOSIS — Z0000 Encounter for general adult medical examination without abnormal findings: Secondary | ICD-10-CM

## 2019-11-14 DIAGNOSIS — Z30016 Encounter for initial prescription of transdermal patch hormonal contraceptive device: Secondary | ICD-10-CM

## 2019-11-14 MED ORDER — VALACYCLOVIR HCL 500 MG PO TABS: ORAL_TABLET | ORAL | 0 refills | Status: DC

## 2019-11-14 MED ORDER — XULANE 150-35 MCG/24HR TD PTWK: 1.0000 | MEDICATED_PATCH | TRANSDERMAL | 3 refills | Status: AC

## 2019-11-14 NOTE — Progress Notes (Signed)
Chief Complaint  Patient presents with  . Annual Exam    pt would like to discuss restarting birth control, pt is feeling well today , pt states that valtrex is wokring as well as it has in the past wonders if needs a Rx change    Subjective:  Leah [name] is a 29 y.o. female here for a health maintenance visit.  Patient is established pt  She previously tried nuvaring, nexplanon She states that she is not sure if the nuvaring caused her depression She also tried OCPs but feels like it was a Risk analyst every month.    Patient Active Problem List   Diagnosis Date Noted  . Oral herpes 03/23/2019  . Well controlled intermittent asthma 03/23/2019  . Dermoid cyst of left ovary 11/23/2013    Past Medical History:  Diagnosis Date  . Asthma   . Herpes genitalia   . Ovarian cyst     Past Surgical History:  Procedure Laterality Date  . EYE MUSCLE SURGERY    . OVARIAN CYST REMOVAL       Outpatient Medications Prior to Visit  Medication Sig Dispense Refill  . albuterol (VENTOLIN HFA) 108 (90 Base) MCG/ACT inhaler Inhale 2 puffs into the lungs every 6 (six) hours as needed for wheezing or shortness of breath. 18 g 1  . escitalopram (LEXAPRO) 20 MG tablet TAKE 1 TABLET BY MOUTH  DAILY 90 tablet 3  . valACYclovir (VALTREX) 500 MG tablet Take 1 tablet (500 mg total) by mouth daily. 90 tablet 3  . HYDROcodone-acetaminophen (NORCO/VICODIN) 5-325 MG tablet Take 2 tablets by mouth every 4 (four) hours as needed. 8 tablet 0   No facility-administered medications prior to visit.    Allergies  Allergen Reactions  . Penicillins Other (See Comments)    Family allergy  Has patient had a PCN reaction causing immediate rash, facial/tongue/throat swelling, SOB or lightheadedness with hypotension: No Has patient had a PCN reaction causing severe rash involving mucus membranes or skin necrosis: No Has patient had a PCN reaction that required hospitalization No Has patient had a  PCN reaction occurring within the last 10 years: No If all of the above answers are "NO", then may proceed with Cephalosporin use.     Family History  Problem Relation Age of Onset  . Asthma Father   . Hyperlipidemia Father   . Asthma Brother   . Cancer Mother        lung  . Asthma Sister   . Asthma Paternal Aunt   . Asthma Paternal Grandmother      Health Habits: Dental Exam: up to date Eye Exam: up to date Exercise: 0 times/week on average Current exercise activities: none Diet: balanced  Social History   Socioeconomic History  . Marital status: Single    Spouse name: Not on file  . Number of children: Not on file  . Years of education: Not on file  . Highest education level: Not on file  Occupational History  . Not on file  Tobacco Use  . Smoking status: Never Smoker  . Smokeless tobacco: Never Used  Substance and Sexual Activity  . Alcohol use: No    Alcohol/week: 0.0 standard drinks    Comment: ETOh once a week, no binge drinker  . Drug use: No  . Sexual activity: Yes    Partners: Female, Female    Birth control/protection: Implant  Other Topics Concern  . Not on file  Social History Narrative   Single  Current student at [employer] Strain:   . Difficulty of Paying Living Expenses: Not on file  Food Insecurity:   . Worried About Charity fundraiser in the Last Year: Not on file  . Ran Out of Food in the Last Year: Not on file  Transportation Needs:   . Lack of Transportation (Medical): Not on file  . Lack of Transportation (Non-Medical): Not on file  Physical Activity:   . Days of Exercise per Week: Not on file  . Minutes of Exercise per Session: Not on file  Stress:   . Feeling of Stress : Not on file  Social Connections:   . Frequency of Communication with Friends and Family: Not on file  . Frequency of Social Gatherings with Friends and Family: Not on file  . Attends Religious Services: Not on  file  . Active Member of Clubs or Organizations: Not on file  . Attends Archivist Meetings: Not on file  . Marital Status: Not on file  Intimate Partner Violence:   . Fear of Current or Ex-Partner: Not on file  . Emotionally Abused: Not on file  . Physically Abused: Not on file  . Sexually Abused: Not on file   Social History   Substance and Sexual Activity  Alcohol Use No  . Alcohol/week: 0.0 standard drinks   Comment: ETOh once a week, no binge drinker   Social History   Tobacco Use  Smoking Status Never Smoker  Smokeless Tobacco Never Used   Social History   Substance and Sexual Activity  Drug Use No    GYN: Sexual Health Menstrual status: regular menses LMP: Patient's last menstrual period was 10/31/2019 (exact date). Last pap smear: see HM section History of abnormal pap smears:  Sexually active: with  partner Current contraception:   Health Maintenance: See under health Maintenance activity for review of completion dates as well. Immunization History  Administered Date(s) Administered  . PPD Test 09/21/2017  . Tdap 09/15/2014      Depression Screen-PHQ2/9 Depression screen [facility] 2/9 11/14/2019 07/13/2019 03/23/2019 02/10/2019 01/18/2019  Decreased Interest 0 0 0 0 1  Down, Depressed, Hopeless 0 0 0 0 2  PHQ - 2 Score 0 0 0 0 3  Altered sleeping - - - - 1  Tired, decreased energy - - - - 1  Change in appetite - - - - 1  Feeling bad or failure about yourself  - - - - 0  Trouble concentrating - - - - 2  Moving slowly or fidgety/restless - - - - 3  Suicidal thoughts - - - - 0  PHQ-9 Score - - - - 11  Difficult doing work/chores - - - - Extremely dIfficult       Depression Severity and Treatment Recommendations:  0-4= None  5-9= Mild / Treatment: Support, educate to call if worse; return in one month  10-14= Moderate / Treatment: Support, watchful waiting; Antidepressant or Psycotherapy  15-19= Moderately severe / Treatment: Antidepressant OR  Psychotherapy  >= 20 = Major depression, severe / Antidepressant AND Psychotherapy    Review of Systems   ROS  See HPI for ROS as well.   Review of Systems  Constitutional: Negative for activity change, appetite change, chills and fever.  HENT: Negative for congestion, nosebleeds, trouble swallowing and voice change.   Respiratory: Negative for cough, shortness of breath and wheezing.   Gastrointestinal: Negative for diarrhea, nausea and vomiting.  Genitourinary: Negative for difficulty urinating, dysuria, flank pain and hematuria.  Musculoskeletal: Negative for back pain, joint swelling and neck pain.  Neurological: Negative for dizziness, speech difficulty, light-headedness and numbness.  See HPI. All other review of systems negative.    Objective:   Vitals:   11/14/19 1101  BP: 107/67  Pulse: 72  Resp: 15  Temp: 97.9 F (36.6 C)  TempSrc: Temporal  SpO2: 100%  Weight: 159 lb (72.1 kg)  Height: 5\' 6"  (1.676 m)    Body mass index is 25.66 kg/m.  Physical Exam Constitutional:      Appearance: Normal appearance. She is normal weight.  HENT:     Head: Normocephalic and atraumatic.     Nose: Nose normal. No congestion.  Eyes:     Extraocular Movements: Extraocular movements intact.     Conjunctiva/sclera: Conjunctivae normal.  Neck:     Comments: No thyromegaly Cardiovascular:     Rate and Rhythm: Normal rate and regular rhythm.     Pulses: Normal pulses.     Heart sounds: No murmur.  Pulmonary:     Effort: Pulmonary effort is normal. No respiratory distress.     Breath sounds: Normal breath sounds. No wheezing.  Abdominal:     General: Abdomen is flat. Bowel sounds are normal. There is no distension.     Palpations: Abdomen is soft. There is no mass.     Tenderness: There is no abdominal tenderness. There is no right CVA tenderness, left CVA tenderness, guarding or rebound.     Hernia: No hernia is present.  Musculoskeletal:        General: No swelling or  tenderness. Normal range of motion.     Cervical back: Normal range of motion and neck supple.  Skin:    General: Skin is warm.     Capillary Refill: Capillary refill takes less than 2 seconds.  Neurological:     Mental Status: She is alert.  Psychiatric:        Mood and Affect: Mood normal.        Behavior: Behavior normal.        Thought Content: Thought content normal.        Judgment: Judgment normal.    Chaperone Present Breast- symmetric, non-tender, no nipple discharge Labia normal bilaterally without skin lesions Urethral meatus normal appearing without erythema Vagina without discharge No CMT, ovaries small and not palpable Uterus midline, nontender Pap smear performed     Assessment/Plan:   Patient was seen for a health maintenance exam.  Counseled the patient on health maintenance issues. Reviewed her health mainteance schedule and ordered appropriate tests (see orders.) Counseled on regular exercise and weight management. Recommend regular eye exams and dental cleaning.   The following issues were addressed today for health maintenance:   Leah [name] was seen today for annual exam.  Diagnoses and all orders for this visit:  Routine health maintenance-  Women's Health Maintenance Plan Advised monthly breast exam and annual mammogram Advised dental exam every six months Discussed stress management Discussed pap smear screening guidelines   Screening for cervical cancer- Discussed cervical cancer screening  As well as screening for HPV Discussed risk factors for cervical cancer  And hpv vaccination records reviewed  -     Cytology - PAP([place])   Encounter for initial prescription of transdermal patch hormonal contraceptive device- Education given regarding options for contraception, including barrier methods, injectable contraception, IUD placement, oral contraceptives.  -     norelgestromin-ethinyl estradiol Marilu Favre)  150-35 MCG/24HR transdermal patch;  Place 1 patch onto the skin once a week.    No follow-ups on file.    Body mass index is 25.66 kg/m.:  Discussed the patient's BMI with patient. The BMI body mass index is 25.66 kg/m.     No future appointments.  Patient Instructions       If you have lab work done today you will be contacted with your lab results within the next 2 weeks.  If you have not heard from [place] then please contact us. The fastest way to get your results is to register for My Chart.   IF you received an x-ray today, you will receive an invoice from [facility]. Please contact [facility] at [phone] with questions or concerns regarding your invoice.   IF you received labwork today, you will receive an invoice from [place]. Please contact [facility] at [phone] with questions or concerns regarding your invoice.   Our billing staff will not be able to assist you with questions regarding bills from these companies.  You will be contacted with the lab results as soon as they are available. The fastest way to get your results is to activate your My Chart account. Instructions are located on the last page of this paperwork. If you have not heard from [place] regarding the results in 2 weeks, please contact this office.

## 2019-11-14 NOTE — Patient Instructions (Addendum)
If you have lab work done today you will be contacted with your lab results within the next 2 weeks.  If you have not heard from [place] then please contact us. The fastest way to get your results is to register for My Chart.   IF you received an x-ray today, you will receive an invoice from [facility]. Please contact [facility] at [phone] with questions or concerns regarding your invoice.   IF you received labwork today, you will receive an invoice from [place]. Please contact [facility] at [phone] with questions or concerns regarding your invoice.   Our billing staff will not be able to assist you with questions regarding bills from these companies.  You will be contacted with the lab results as soon as they are available. The fastest way to get your results is to activate your My Chart account. Instructions are located on the last page of this paperwork. If you have not heard from [place] regarding the results in 2 weeks, please contact this office.     Contraception Choices Contraception, also called birth control, means things to use or ways to try not to get pregnant. Hormonal birth control This kind of birth control uses hormones. Here are some types of hormonal birth control:  A tube that is put under skin of the arm (implant). The tube can stay in for as long as 3 years.  Shots to get every 3 months (injections).  Pills to take every day (birth control pills).  A patch to change 1 time each week for 3 weeks (birth control patch). After that, the patch is taken off for 1 week.  A ring to put in the vagina. The ring is left in for 3 weeks. Then it is taken out of the vagina for 1 week. Then a new ring is put in.  Pills to take after unprotected sex (emergency birth control pills). Barrier birth control Here are some types of barrier birth control:  A thin covering that is put on the penis before sex (female condom). The covering is thrown away after  sex.  A soft, loose covering that is put in the vagina before sex (female condom). The covering is thrown away after sex.  A rubber bowl that sits over the cervix (diaphragm). The bowl must be made for you. The bowl is put into the vagina before sex. The bowl is left in for 6-8 hours after sex. It is taken out within 24 hours.  A small, soft cup that fits over the cervix (cervical cap). The cup must be made for you. The cup can be left in for 6-8 hours after sex. It is taken out within 48 hours.  A sponge that is put into the vagina before sex. It must be left in for at least 6 hours after sex. It must be taken out within 30 hours. Then it is thrown away.  A chemical that kills or stops sperm from getting into the uterus (spermicide). It may be a pill, cream, jelly, or foam to put in the vagina. The chemical should be used at least 10-15 minutes before sex. IUD (intrauterine) birth control An IUD is a small, T-shaped piece of plastic. It is put inside the uterus. There are two kinds:  Hormone IUD. This kind can stay in for 3-5 years.  Copper IUD. This kind can stay in for 10 years. Permanent birth control Here are some types of permanent birth control:  Surgery to block the  fallopian tubes.  Having an insert put into each fallopian tube.  Surgery to tie off the tubes that carry sperm (vasectomy). Natural planning birth control Here are some types of natural planning birth control:  Not having sex on the days the woman could get pregnant.  Using a calendar: ? To keep track of the length of each period. ? To find out what days pregnancy can happen. ? To plan to not have sex on days when pregnancy can happen.  Watching for symptoms of ovulation and not having sex during ovulation. One way the woman can check for ovulation is to check her temperature.  Waiting to have sex until after ovulation. Summary  Contraception, also called birth control, means things to use or ways to try  not to get pregnant.  Hormonal methods of birth control include implants, injections, pills, patches, vaginal rings, and emergency birth control pills.  Barrier methods of birth control can include female condoms, female condoms, diaphragms, cervical caps, sponges, and spermicides.  There are two types of IUD (intrauterine device) birth control. An IUD can be put in a woman's uterus to prevent pregnancy for 3-5 years.  Permanent sterilization can be done through a procedure for males, females, or both.  Natural planning methods involve not having sex on the days when the woman could get pregnant. This information is not intended to replace advice given to you by your health care provider. Make sure you discuss any questions you have with your health care provider. Document Revised: 12/22/2018 Document Reviewed: 09/11/2016 Elsevier Patient Education  [place].

## 2019-11-15 LAB — CYTOLOGY - PAP
Chlamydia: NEGATIVE
Comment: NEGATIVE
Comment: NORMAL
Diagnosis: NEGATIVE
Neisseria Gonorrhea: NEGATIVE

## 2019-11-16 ENCOUNTER — Encounter: Payer: Self-pay | Admitting: Family Medicine

## 2019-11-18 ENCOUNTER — Telehealth: Payer: Self-pay | Admitting: Family Medicine

## 2019-11-18 NOTE — Telephone Encounter (Signed)
Pt states she called her pharmacy to check on her birth control and they told her they need clarification from our office to fill. The pharmacy told her they had sent a fax request over for clarification. norelgestromin-ethinyl estradiol Marilu Favre) 150-35 MCG/24HR transdermal patch [id number]   Order Details Dose: 1 patch Route: Transdermal   Pharmacy  [facility], [place] [facility]  [address] [place] [address], [place] [address]  Phone:  [phone] Fax:  [phone]  DEA #:  --   Please advise at [phone].

## 2019-11-22 ENCOUNTER — Encounter: Payer: Self-pay | Admitting: Family Medicine

## 2019-11-29 ENCOUNTER — Other Ambulatory Visit: Payer: Self-pay | Admitting: Family Medicine

## 2019-11-29 NOTE — Telephone Encounter (Signed)
Spoke with patient regarding Xulane transdermal patch prescription. Pt stated [facility] rx needed clarifications on the directions. Contacted the pharmacy to provide clarifications. Patient given options for HCG testing and instructions. Pt verbalizes understanding.

## 2019-11-29 NOTE — Telephone Encounter (Signed)
What is the name of the medication? norelgestromin-ethinyl estradiol Marilu Favre) 150-35 MCG/24HR transdermal patch [id number]    Have you contacted your pharmacy to request a refill? Y  Which pharmacy would you like this sent to? [facility]  [address], [place] [address]    Patient notified that their request is being sent to the clinical staff for review and that they should receive a call once it is complete. If they do not receive a call within 72 hours they can check with their pharmacy or our office.   PATIENT HAS NOT RECEIVED ORIGINAL PRESCRIPTION / PLEASE [facility] PHARMACY IS REQUESTING MORE INFO  PATIENT IS FOLLOWING UP    PATIENT CALLED 2 WEEKS AGO AND IS WANTING TO KNOW THE RESULTS OF  HER PREGNANCY TEST

## 2019-12-05 ENCOUNTER — Telehealth: Payer: Self-pay | Admitting: Family Medicine

## 2019-12-05 NOTE — Telephone Encounter (Signed)
Pt called regarding birth control patches. Per pt she was directed to start patches on the Sunday after her next period but before then take a pregnancy test. Pt states peirod was late took pregnancy test it was neg but still hasn't gotten period. Pt states this isn't irregular for her to be late. Pt would like a nurses to give her a call for guidance to start patches or wait for period. [phone] Please advise.

## 2019-12-05 NOTE — Telephone Encounter (Signed)
Called patient and informed her that it was okay to go ahead and start the birth control patches since the pregnancy test was negative.

## 2019-12-27 ENCOUNTER — Telehealth: Payer: 59 | Admitting: Emergency Medicine

## 2019-12-27 DIAGNOSIS — R300 Dysuria: Secondary | ICD-10-CM | POA: Diagnosis not present

## 2019-12-27 MED ORDER — CEPHALEXIN 500 MG PO CAPS: 500.0000 mg | ORAL_CAPSULE | Freq: Two times a day (BID) | ORAL | 0 refills | Status: DC

## 2019-12-27 MED ORDER — FLUCONAZOLE 150 MG PO TABS: 150.0000 mg | ORAL_TABLET | Freq: Once | ORAL | 0 refills | Status: AC

## 2019-12-27 NOTE — Progress Notes (Signed)
We are sorry that you are not feeling well.  Here is how we plan to help!  Based on what you shared with me it looks like you most likely have a simple urinary tract infection.  A UTI (Urinary Tract Infection) is a bacterial infection of the bladder.  Most cases of urinary tract infections are simple to treat but a key part of your care is to encourage you to drink plenty of fluids and watch your symptoms carefully.  I have prescribed Keflex 500 mg twice a day for 7 days.  Your symptoms should gradually improve. Call us if the burning in your urine worsens, you develop worsening fever, back pain or pelvic pain or if your symptoms do not resolve after completing the antibiotic.  I also sent 1 tablet of Diflucan for yeast infection to be taken after you finish the course of antibiotics.  Urinary tract infections can be prevented by drinking plenty of water to keep your body hydrated.  Also be sure when you wipe, wipe from front to back and don't hold it in!  If possible, empty your bladder every 4 hours.  Your e-visit answers were reviewed by a board certified advanced clinical practitioner to complete your personal care plan.  Depending on the condition, your plan could have included both over the counter or prescription medications.  If there is a problem please reply  once you have received a response from your provider.  Your safety is important to [place].  If you have drug allergies check your prescription carefully.    You can use MyChart to ask questions about today's visit, request a non-urgent call back, or ask for a work or school excuse for 24 hours related to this e-Visit. If it has been greater than 24 hours you will need to follow up with your provider, or enter a new e-Visit to address those concerns.   You will get an e-mail in the next two days asking about your experience.  I hope that your e-visit has been valuable and will speed your recovery. Thank you for using  e-visits.  Approximately 5 minutes was used in reviewing the patient's chart, questionnaire, prescribing medications, and documentation.

## 2020-01-20 ENCOUNTER — Other Ambulatory Visit: Payer: Self-pay | Admitting: Family Medicine

## 2020-01-20 DIAGNOSIS — B002 Herpesviral gingivostomatitis and pharyngotonsillitis: Secondary | ICD-10-CM

## 2020-03-08 ENCOUNTER — Other Ambulatory Visit: Payer: Self-pay

## 2020-03-08 ENCOUNTER — Emergency Department (HOSPITAL_COMMUNITY): Payer: 59

## 2020-03-08 ENCOUNTER — Encounter (HOSPITAL_COMMUNITY): Payer: Self-pay | Admitting: Emergency Medicine

## 2020-03-08 ENCOUNTER — Emergency Department (HOSPITAL_COMMUNITY)
Admission: EM | Admit: 2020-03-08 | Discharge: 2020-03-08 | Disposition: A | Discharge status: Home / Self Care | Payer: 59 | Attending: Emergency Medicine | Admitting: Emergency Medicine

## 2020-03-08 DIAGNOSIS — G43009 Migraine without aura, not intractable, without status migrainosus: Secondary | ICD-10-CM | POA: Insufficient documentation

## 2020-03-08 DIAGNOSIS — Z880 Allergy status to penicillin: Secondary | ICD-10-CM | POA: Diagnosis not present

## 2020-03-08 DIAGNOSIS — M79604 Pain in right leg: Secondary | ICD-10-CM | POA: Insufficient documentation

## 2020-03-08 DIAGNOSIS — J45909 Unspecified asthma, uncomplicated: Secondary | ICD-10-CM | POA: Diagnosis not present

## 2020-03-08 DIAGNOSIS — R519 Headache, unspecified: Secondary | ICD-10-CM | POA: Diagnosis present

## 2020-03-08 LAB — I-STAT BETA HCG BLOOD, ED (MC, WL, AP ONLY): I-stat hCG, quantitative: <5 m[IU]/mL (ref <5)

## 2020-03-08 LAB — PREGNANCY, URINE: Preg Test, Ur: NEGATIVE

## 2020-03-08 MED ORDER — CYCLOBENZAPRINE HCL 10 MG PO TABS: 10.0000 mg | ORAL_TABLET | Freq: Two times a day (BID) | ORAL | 0 refills | Status: AC | PRN

## 2020-03-08 MED ORDER — PROMETHAZINE HCL 25 MG/ML IJ SOLN
12.5000 mg | Freq: Once | INTRAMUSCULAR | Status: AC
  Administered 2020-03-08: 12.5 mg via INTRAVENOUS
  Filled 2020-03-08: qty 1

## 2020-03-08 MED ORDER — SODIUM CHLORIDE 0.9 % IV BOLUS (SEPSIS)
1000.0000 mL | Freq: Once | INTRAVENOUS | Status: AC
  Administered 2020-03-08: 1000 mL via INTRAVENOUS

## 2020-03-08 MED ORDER — HYDROCODONE-ACETAMINOPHEN 5-325 MG PO TABS
1.0000 | ORAL_TABLET | Freq: Once | ORAL | Status: AC
  Administered 2020-03-08: 1 via ORAL
  Filled 2020-03-08: qty 1

## 2020-03-08 MED ORDER — KETOROLAC TROMETHAMINE 15 MG/ML IJ SOLN
15.0000 mg | Freq: Once | INTRAMUSCULAR | Status: AC
  Administered 2020-03-08: 15 mg via INTRAVENOUS
  Filled 2020-03-08: qty 1

## 2020-03-08 MED ORDER — NAPROXEN 500 MG PO TABS
500.0000 mg | ORAL_TABLET | Freq: Two times a day (BID) | ORAL | 0 refills | Status: AC
Start: 2020-03-08 — End: 2020-03-23
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
## 2020-03-08 NOTE — ED Notes (Signed)
Pt ambulated to restroom.Pt complains of minor pain upon ambulation but is steady on her feet.

## 2020-03-14 ENCOUNTER — Encounter: Payer: 59 | Admitting: Registered Nurse

## 2020-03-16 ENCOUNTER — Other Ambulatory Visit: Payer: Self-pay | Admitting: Family Medicine

## 2020-03-16 DIAGNOSIS — G43009 Migraine without aura, not intractable, without status migrainosus: Secondary | ICD-10-CM

## 2020-03-21 ENCOUNTER — Encounter: Payer: Self-pay | Admitting: Neurology

## 2020-04-02 ENCOUNTER — Ambulatory Visit
Admission: RE | Admit: 2020-04-02 | Discharge: 2020-04-02 | Disposition: A | Discharge status: Home / Self Care | Payer: 59 | Source: Ambulatory Visit | Attending: Family Medicine | Admitting: Family Medicine

## 2020-04-02 ENCOUNTER — Other Ambulatory Visit: Payer: Self-pay

## 2020-04-02 DIAGNOSIS — G43009 Migraine without aura, not intractable, without status migrainosus: Secondary | ICD-10-CM

## 2020-04-26 ENCOUNTER — Other Ambulatory Visit: Payer: Self-pay

## 2020-04-26 ENCOUNTER — Ambulatory Visit
Admission: RE | Admit: 2020-04-26 | Discharge: 2020-04-26 | Disposition: A | Discharge status: Home / Self Care | Payer: 59 | Source: Ambulatory Visit | Attending: Family Medicine | Admitting: Family Medicine

## 2020-04-26 MED ORDER — GADOBENATE DIMEGLUMINE 529 MG/ML IV SOLN
15.0000 mL | Freq: Once | INTRAVENOUS | Status: AC | PRN
  Administered 2020-04-26: 15 mL via INTRAVENOUS
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
## 2020-06-12 ENCOUNTER — Other Ambulatory Visit: Payer: Self-pay

## 2020-06-12 ENCOUNTER — Ambulatory Visit (INDEPENDENT_AMBULATORY_CARE_PROVIDER_SITE_OTHER): Payer: 59 | Admitting: Neurology

## 2020-06-12 ENCOUNTER — Encounter: Payer: Self-pay | Admitting: Neurology

## 2020-06-12 VITALS — BP 112/78 | HR 91 | Ht 66.0 in | Wt 163.2 lb

## 2020-06-12 DIAGNOSIS — G43109 Migraine with aura, not intractable, without status migrainosus: Secondary | ICD-10-CM | POA: Diagnosis not present

## 2020-06-12 NOTE — Patient Instructions (Addendum)
  1. Take Excedrin as needed/directed for migraines.   2. Limit use of pain relievers (Excedrin) to no more than 2 days out of the week.  These medications include acetaminophen, NSAIDs (ibuprofen/Advil/Motrin, naproxen/Aleve, triptans (Imitrex/sumatriptan), Excedrin, and narcotics.  This will help reduce risk of rebound headaches. 3. Be aware of common food triggers:  - Caffeine:  coffee, black tea, cola, Mt. Dew  - Chocolate  - Dairy:  aged cheeses (brie, blue, cheddar, gouda, Tracy, provolone, Snyder, Swiss, etc), chocolate milk, buttermilk, sour cream, limit eggs and yogurt  - Nuts, peanut butter  - Alcohol  - Cereals/grains:  FRESH breads (fresh bagels, sourdough, doughnuts), yeast productions  - Processed/canned/aged/cured meats (pre-packaged deli meats, hotdogs)  - MSG/glutamate:  soy sauce, flavor enhancer, pickled/preserved/marinated foods  - Sweeteners:  aspartame (Equal, Nutrasweet).  Sugar and Splenda are okay  - Vegetables:  legumes (lima beans, lentils, snow peas, fava beans, pinto peans, peas, garbanzo beans), sauerkraut, onions, olives, pickles  - Fruit:  avocados, bananas, citrus fruit (orange, lemon, grapefruit), mango  - Other:  Frozen meals, macaroni and cheese 4. Routine exercise 5. Stay adequately hydrated (aim for 64 oz water daily) 6. Keep headache diary 7. Maintain proper stress management 8. Maintain proper sleep hygiene 9. Do not skip meals 10. Consider supplements:  magnesium citrate 400mg  daily, riboflavin 400mg  daily, coenzyme Q10 100mg  three times daily. 11.  Follow up in 4 to 6 months.
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
## 2020-12-18 ENCOUNTER — Ambulatory Visit: Payer: 59 | Admitting: Neurology

## 2021-01-31 IMAGING — MR MR HEAD WO/W CM
15 series · 48 of 48 positions shown · IV contrast (15ml Multihance)
Comparison: CT head 03/08/2020

CLINICAL DATA: Migraine headache without Shnkar and without status
migrainosus. Non intractable.

EXAM:
MRI HEAD WITHOUT AND WITH CONTRAST
TECHNIQUE: Multiplanar, multiecho pulse sequences of the brain and surrounding
structures were obtained without and with intravenous contrast.
CONTRAST:  15mL MULTIHANCE GADOBENATE DIMEGLUMINE 529 MG/ML IV SOLN

[Series 2: T1 · sagittal · 5.0mm · 0.45mm/px · 2 of 21 slices shown]
[im 1/21]
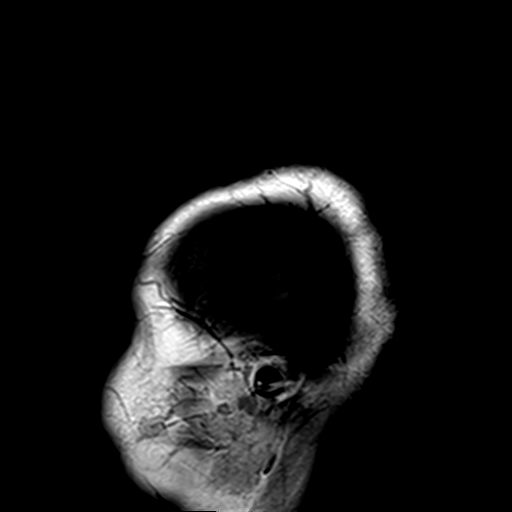
[im 21/21]
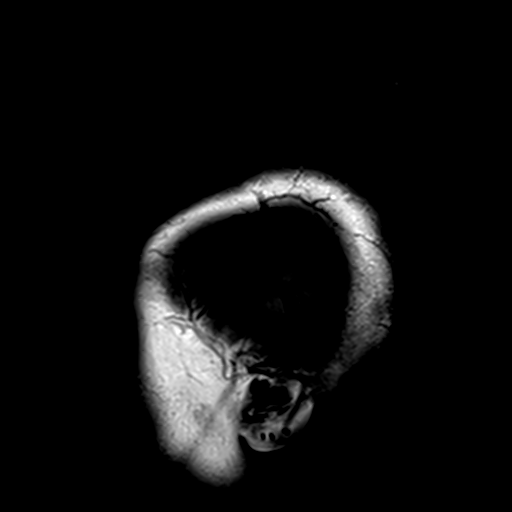

[Series 3: DWI · axial · 3.0mm · 1.80mm/px · z∈[-122,+23]mm · 7 of 100 slices shown (1 of 6)]
[im 1/100]
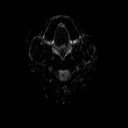
[im 17/100]
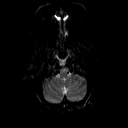
[im 34/100]
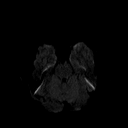
[im 50/100]
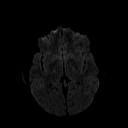
[im 67/100]
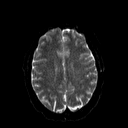
[im 83/100]
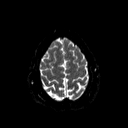
[im 100/100]
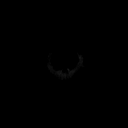

[Series 4: DWI · axial · 3.0mm · 1.80mm/px · z∈[-122,+23]mm · 3 of 50 slices shown (2 of 6)]
[im 1/50]
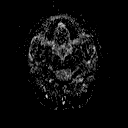
[im 25/50]
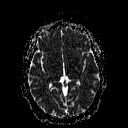
[im 50/50]
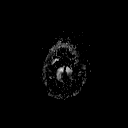

[Series 5: DWI · coronal · 5.0mm · 1.80mm/px · 4 of 68 slices shown (3 of 6)]
[im 1/68]
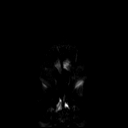
[im 23/68]
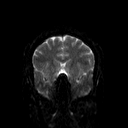
[im 45/68]
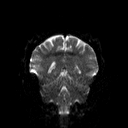
[im 68/68]
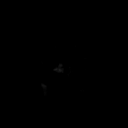

[Series 6: DWI · coronal · 5.0mm · 1.80mm/px · 2 of 34 slices shown (4 of 6)]
[im 1/34]
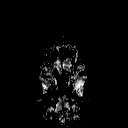
[im 34/34]
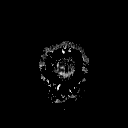

[Series 7: T2 · axial · 5.0mm · 0.60mm/px · 1 of 22 slices shown (1 of 2)]
[im 1/22]
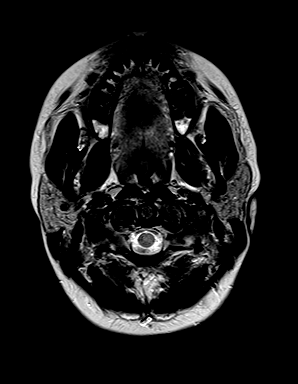

[Series 8: DWI · coronal · 5.0mm · 1.80mm/px · 4 of 68 slices shown (5 of 6)]
[im 1/68]
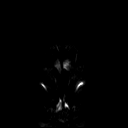
[im 23/68]
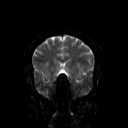
[im 45/68]
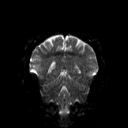
[im 68/68]
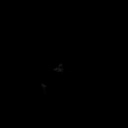

[Series 9: DWI · coronal · 5.0mm · 1.80mm/px · 2 of 34 slices shown (6 of 6)]
[im 1/34]
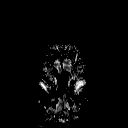
[im 34/34]
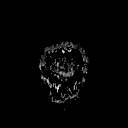

[Series 10: FLAIR · axial · 3.0mm · 0.45mm/px · z∈[-121,+22]mm · 2 of 32 slices shown (1 of 2)]
[im 1/32]
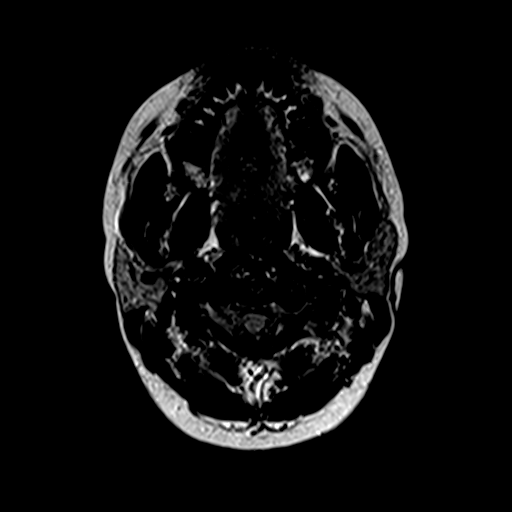
[im 32/32]
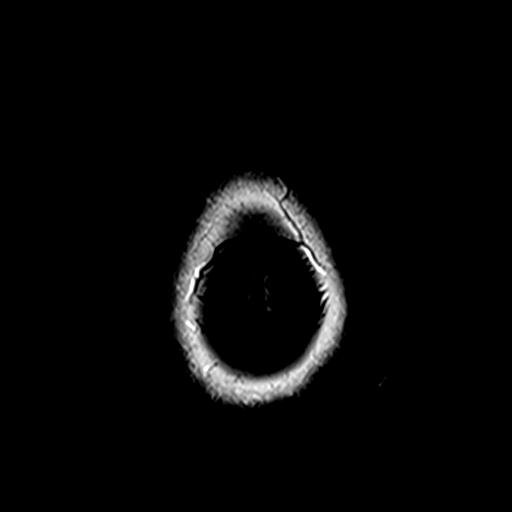

[Series 12: swi_images · axial · 4.0mm · 0.90mm/px · z∈[-118,+20]mm · 2 of 36 slices shown]
[im 1/36]
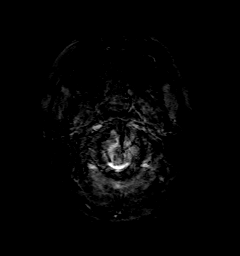
[im 36/36]
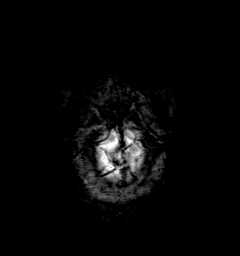

[Series 13: t1_mpr_tra · axial · 1.0mm · 0.75mm/px · z∈[-120,+21]mm · 8 of 144 slices shown (1 of 2)]
[im 1/144]
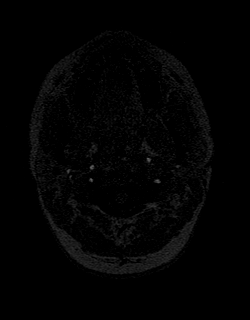
[im 21/144]
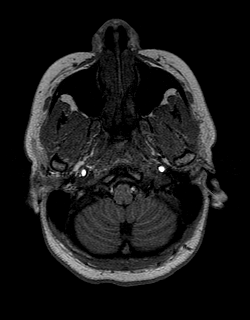
[im 41/144]
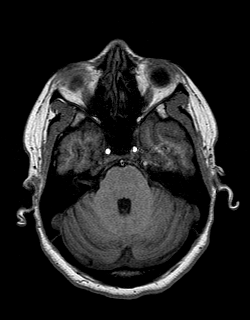
[im 62/144]
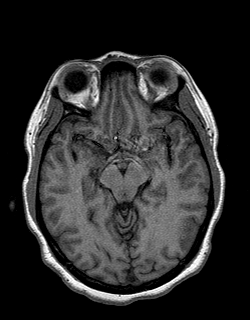
[im 82/144]
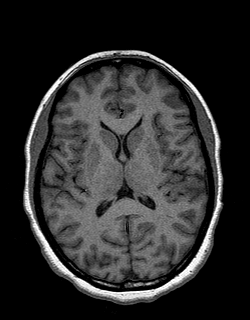
[im 103/144]
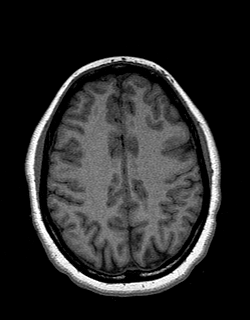
[im 123/144]
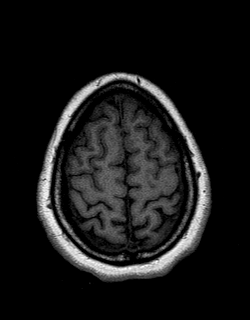
[im 144/144]
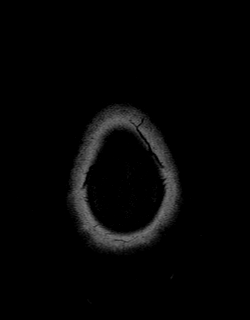

[Series 14: FLAIR · sagittal · 5.0mm · 0.45mm/px · 1 of 25 slices shown (2 of 2)]
[im 1/25]
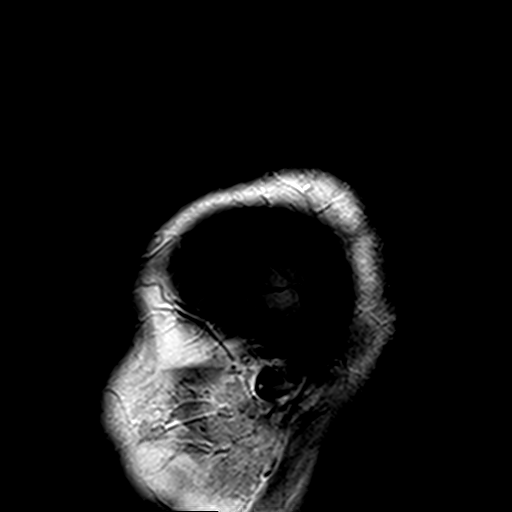

[Series 15: T2 · coronal · 5.0mm · 0.45mm/px · 1 of 25 slices shown (2 of 2)]
[im 1/25]
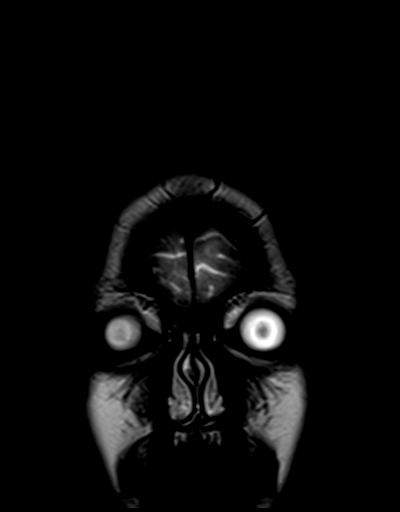

[Series 16: t1_mpr_tra · axial · 1.0mm · 0.75mm/px · z∈[-120,+21]mm · 8 of 144 slices shown (2 of 2)]
[im 1/144]
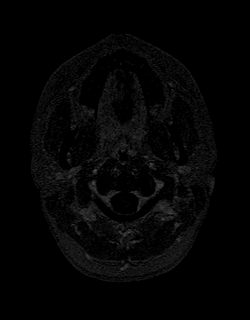
[im 21/144]
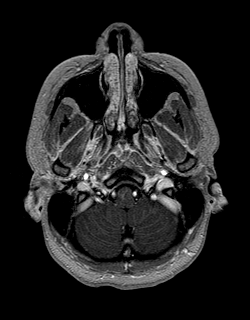
[im 41/144]
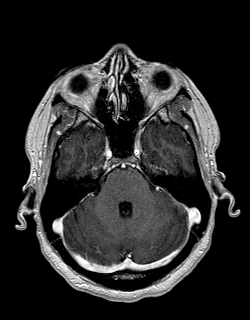
[im 62/144]
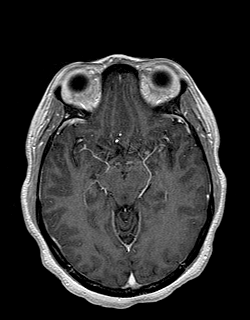
[im 82/144]
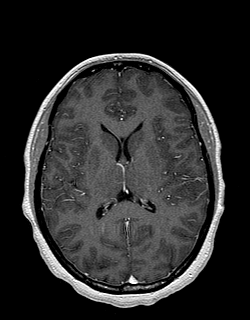
[im 103/144]
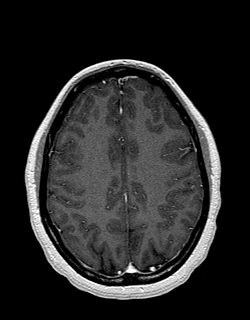
[im 123/144]
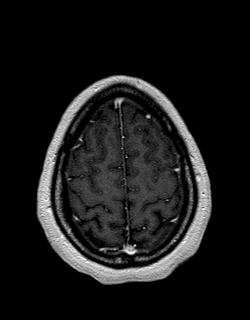
[im 144/144]
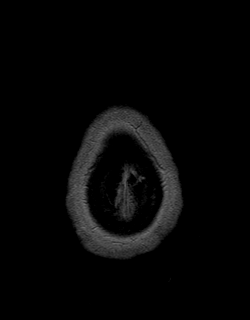

[Series 17: post cor · coronal · 5.0mm · 0.45mm/px · 1 of 25 slices shown]
[im 1/25]
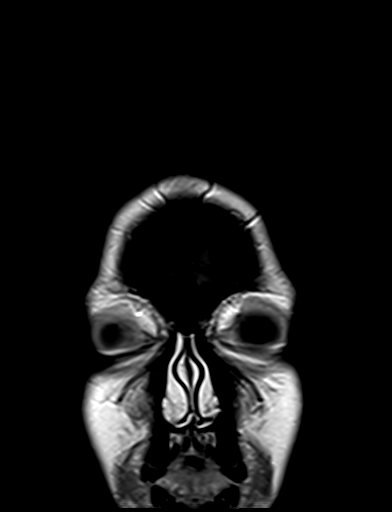

[48 of 48 positions shown; findings below may reference images not displayed]

FINDINGS: Brain: No acute infarction, hemorrhage, hydrocephalus, extra-axial
collection or mass lesion. Normal cerebral white matter.

Normal enhancement

Vascular: Normal arterial flow voids.

Skull and upper cervical spine: No focal skeletal lesion.

Sinuses/Orbits: Paranasal sinuses clear.  Negative orbit

Other: None
IMPRESSION: Normal MRI head with contrast

## 2021-03-14 ENCOUNTER — Other Ambulatory Visit: Payer: Self-pay

## 2021-03-14 ENCOUNTER — Ambulatory Visit (HOSPITAL_COMMUNITY)
Admission: EM | Admit: 2021-03-14 | Discharge: 2021-03-14 | Disposition: A | Discharge status: Home / Self Care | Payer: Self-pay | Attending: Emergency Medicine | Admitting: Emergency Medicine

## 2021-03-14 ENCOUNTER — Encounter (HOSPITAL_COMMUNITY): Payer: Self-pay

## 2021-03-14 DIAGNOSIS — T1591XA Foreign body on external eye, part unspecified, right eye, initial encounter: Secondary | ICD-10-CM

## 2021-03-14 MED ORDER — DICLOFENAC SODIUM 0.1 % OP SOLN: 1.0000 [drp] | Freq: Four times a day (QID) | OPHTHALMIC | 0 refills | Status: AC

## 2021-03-14 MED ORDER — POLYMYXIN B-TRIMETHOPRIM 10000-0.1 UNIT/ML-% OP SOLN
1.0000 [drp] | OPHTHALMIC | 0 refills | Status: AC
Start: 2021-03-14 — End: ?

## 2021-03-14 MED ORDER — TETRACAINE HCL 0.5 % OP SOLN
OPHTHALMIC | Status: AC
  Filled 2021-03-14: qty 4

## 2021-03-14 MED ORDER — FLUORESCEIN SODIUM 1 MG OP STRP
ORAL_STRIP | OPHTHALMIC | Status: AC
  Filled 2021-03-14: qty 1

## 2021-03-14 NOTE — ED Provider Notes (Signed)
[facility]    CSN: [id number] Arrival date & time: 03/14/21  0907      History   Chief Complaint Chief Complaint  Patient presents with   Eye Pain    HPI Leah [name] is a 30 y.o. female.   Patient here for evaluation of right eye pain and swelling.  Reports having some irritation to the right last night and attempted to remove contacts.  Reports pain and swelling worse this morning.  Unsure if she was able to remove contact lens from right eye.  Reports clear drainage and some photophobia.  Denies any fevers, chest pain, shortness of breath, N/V/D, numbness, tingling, weakness, abdominal pain, or headaches.     The history is provided by the patient.  Eye Pain   Past Medical History:  Diagnosis Date   Asthma    Herpes genitalia    Ovarian cyst     Patient Active Problem List   Diagnosis Date Noted   Oral herpes 03/23/2019   Well controlled intermittent asthma 03/23/2019   Dermoid cyst of left ovary 11/23/2013    Past Surgical History:  Procedure Laterality Date   EYE MUSCLE SURGERY     OVARIAN CYST REMOVAL      OB History     Gravida  0   Para      Term      Preterm      AB      Living         SAB      IAB      Ectopic      Multiple      Live Births               Home Medications    Prior to Admission medications   Medication Sig Start Date End Date Taking? Authorizing Provider  diclofenac (VOLTAREN) 0.1 % ophthalmic solution Place 1 drop into the right eye 4 (four) times daily. 03/14/21  Yes Pearson Forster, NP  trimethoprim-polymyxin b (POLYTRIM) ophthalmic solution Place 1 drop into the right eye every 4 (four) hours. 03/14/21  Yes Pearson Forster, NP  albuterol (VENTOLIN HFA) 108 (90 Base) MCG/ACT inhaler Inhale 2 puffs into the lungs every 6 (six) hours as needed for wheezing or shortness of breath. Patient not taking: Reported on 06/12/2020 03/23/19   Forrest Moron, MD  escitalopram (LEXAPRO) 20 MG tablet TAKE 1  TABLET BY MOUTH  DAILY 09/29/19   Forrest Moron, MD  norelgestromin-ethinyl estradiol Marilu Favre) 150-35 MCG/24HR transdermal patch Place 1 patch onto the skin once a week. Patient not taking: Reported on 06/12/2020 11/14/19   Forrest Moron, MD  valACYclovir (VALTREX) 500 MG tablet TAKE 1 TABLET BY MOUTH  TWICE DAILY FOR 5 DAYS FOR  OUTBREAKS 01/20/20   Forrest Moron, MD    Family History Family History  Problem Relation Age of Onset   Asthma Father    Hyperlipidemia Father    Asthma Brother    Cancer Mother        lung   Asthma Sister    Asthma Paternal Aunt    Asthma Paternal Grandmother     Social History Social History   Tobacco Use   Smoking status: Never   Smokeless tobacco: Never  Vaping Use   Vaping Use: Never used  Substance Use Topics   Alcohol use: No    Alcohol/week: 0.0 standard drinks    Comment: ETOh once a week, no binge drinker  Drug use: No     Allergies   Penicillins   Review of Systems Review of Systems  Eyes:  Positive for photophobia, pain and redness.  All other systems reviewed and are negative.   Physical Exam Triage Vital Signs ED Triage Vitals  Enc Vitals Group     BP 03/14/21 0932 103/70     Pulse Rate 03/14/21 0932 78     Resp 03/14/21 0932 18     Temp 03/14/21 0932 98.4 F (36.9 C)     Temp src --      SpO2 03/14/21 0932 98 %     Weight --      Height --      Head Circumference --      Peak Flow --      Pain Score 03/14/21 0928 7     Pain Loc --      Pain Edu? --      Excl. in [place]? --    No data found.  Updated Vital Signs BP 103/70 (BP Location: Right Arm)   Pulse 78   Temp 98.4 F (36.9 C)   Resp 18   LMP 01/08/2021   SpO2 98%   Visual Acuity Right Eye Distance:   Left Eye Distance:   Bilateral Distance:    Right Eye Near:   Left Eye Near:    Bilateral Near:     Physical Exam Vitals and nursing note reviewed.  Constitutional:      General: She is not in acute distress.    Appearance: Normal  appearance. She is not ill-appearing, toxic-appearing or diaphoretic.  HENT:     Head: Normocephalic and atraumatic.  Eyes:     General: Lids are normal. Vision grossly intact.        Right eye: Foreign body (contact lens removed) present. No hordeolum.     Conjunctiva/sclera:     Right eye: Right conjunctiva is injected. No chemosis or exudate.    Pupils: Pupils are equal, round, and reactive to light.     Right eye: No corneal abrasion or fluorescein uptake.  Cardiovascular:     Rate and Rhythm: Normal rate.     Pulses: Normal pulses.  Pulmonary:     Effort: Pulmonary effort is normal.  Abdominal:     General: Abdomen is flat.  Musculoskeletal:        General: Normal range of motion.     Cervical back: Normal range of motion.  Skin:    General: Skin is warm and dry.  Neurological:     General: No focal deficit present.     Mental Status: She is alert and oriented to person, place, and time.  Psychiatric:        Mood and Affect: Mood normal.     UC Treatments / Results  Labs (all labs ordered are listed, but only abnormal results are displayed) Labs Reviewed - No data to display  EKG   Radiology No results found.  Procedures Procedures (including critical care time)  Medications Ordered in UC Medications - No data to display  Initial Impression / Assessment and Plan / UC Course  I have reviewed the triage vital signs and the nursing notes.  Pertinent labs & imaging results that were available during my care of the patient were reviewed by me and considered in my medical decision making (see chart for details).    Assessment negative for red flags or concerns.  Contact lens was removed from right eye.  Will  treat with Polytrim eyedrops prophylactically.  May use Voltaren eyedrops as needed for pain.  May also take Tylenol and/or ibuprofen as needed for pain and fever.  Recommend following up with ophthalmology as soon as possible for reevaluation and ensure  improvement of symptoms. Final Clinical Impressions(s) / UC Diagnoses   Final diagnoses:  Foreign body of right eye, initial encounter     Discharge Instructions      Use the Polytrim eye drop 1 drop in your right eye every 4 hours while awake for the  next 3-5 days.    You can use the Voltaren eye drop 1 drop in your right eye as needed for pain.    You can also take Tylenol and/or Ibuprofen as needed for pain relief and fever reduction.    Follow up with your eye doctor as soon as possible for re-evaluation.      ED Prescriptions     Medication Sig Dispense Auth. Provider   trimethoprim-polymyxin b (POLYTRIM) ophthalmic solution Place 1 drop into the right eye every 4 (four) hours. 10 mL Pearson Forster, NP   diclofenac (VOLTAREN) 0.1 % ophthalmic solution Place 1 drop into the right eye 4 (four) times daily. 5 mL Pearson Forster, NP      PDMP not reviewed this encounter.   Pearson Forster, NP 03/14/21 1007

## 2021-03-14 NOTE — ED Triage Notes (Signed)
Pt c/o having pain and swelling in right eye. Pt does wear contacts and attempted to remove them last night, she does have light sensitivity, and is unable to fully open right eye. Eye lids to right eye are swollen at this time.  Symptoms started over night.  Pt has not tried any interventions.

## 2021-03-14 NOTE — Discharge Instructions (Addendum)
Use the Polytrim eye drop 1 drop in your right eye every 4 hours while awake for the  next 3-5 days.    You can use the Voltaren eye drop 1 drop in your right eye as needed for pain.    You can also take Tylenol and/or Ibuprofen as needed for pain relief and fever reduction.    Follow up with your eye doctor as soon as possible for re-evaluation.

## 2021-05-10 ENCOUNTER — Emergency Department (HOSPITAL_COMMUNITY)
Admission: EM | Admit: 2021-05-10 | Discharge: 2021-05-11 | Disposition: A | Discharge status: Home / Self Care | Payer: Self-pay | Attending: Emergency Medicine | Admitting: Emergency Medicine

## 2021-05-10 ENCOUNTER — Other Ambulatory Visit: Payer: Self-pay

## 2021-05-10 ENCOUNTER — Ambulatory Visit (INDEPENDENT_AMBULATORY_CARE_PROVIDER_SITE_OTHER): Payer: Self-pay

## 2021-05-10 ENCOUNTER — Encounter (HOSPITAL_COMMUNITY): Payer: Self-pay

## 2021-05-10 ENCOUNTER — Ambulatory Visit (HOSPITAL_COMMUNITY)
Admission: EM | Admit: 2021-05-10 | Discharge: 2021-05-10 | Disposition: A | Discharge status: Home / Self Care | Payer: Self-pay

## 2021-05-10 DIAGNOSIS — R509 Fever, unspecified: Secondary | ICD-10-CM | POA: Insufficient documentation

## 2021-05-10 DIAGNOSIS — R059 Cough, unspecified: Secondary | ICD-10-CM | POA: Insufficient documentation

## 2021-05-10 DIAGNOSIS — Z5321 Procedure and treatment not carried out due to patient leaving prior to being seen by health care provider: Secondary | ICD-10-CM | POA: Insufficient documentation

## 2021-05-10 DIAGNOSIS — R0602 Shortness of breath: Secondary | ICD-10-CM

## 2021-05-10 DIAGNOSIS — R000 Tachycardia, unspecified: Secondary | ICD-10-CM | POA: Insufficient documentation

## 2021-05-10 LAB — POC INFLUENZA A AND B ANTIGEN (URGENT CARE ONLY)
INFLUENZA A ANTIGEN, POC: NEGATIVE
INFLUENZA B ANTIGEN, POC: NEGATIVE

## 2021-05-10 MED ORDER — ACETAMINOPHEN 325 MG PO TABS
650.0000 mg | ORAL_TABLET | Freq: Once | ORAL | Status: AC
  Administered 2021-05-10: 650 mg via ORAL

## 2021-05-10 MED ORDER — ACETAMINOPHEN 325 MG PO TABS
ORAL_TABLET | ORAL | Status: AC
  Filled 2021-05-10: qty 2

## 2021-05-10 NOTE — ED Provider Notes (Signed)
[facility]    CSN: [id number] Arrival date & time: 05/10/21  1945      History   Chief Complaint Chief Complaint  Patient presents with   Chills    HPI Leah [name] is a 30 y.o. female.   HPI  Chills: Patient reports that 3 weeks ago she was diagnosed with COVID.  She states that she had been feeling well up until last night when she had body aches of legs.  She states that today she has felt extremely tired, short of breath and has had chest discomfort especially when breathing. She has felt lightheaded with activity and now has a fever.  A family member who lives with her also has pneumonia at this time.  Patient does have asthma but states that this has not given her trouble in over 10 years.  She thinks that she has a rescue inhaler at home but is honestly unsure and states that it would be expired.  No hemoptysis, vomiting, sore throat.   Past Medical History:  Diagnosis Date   Asthma    Herpes genitalia    Ovarian cyst     Patient Active Problem List   Diagnosis Date Noted   Oral herpes 03/23/2019   Well controlled intermittent asthma 03/23/2019   Dermoid cyst of left ovary 11/23/2013    Past Surgical History:  Procedure Laterality Date   EYE MUSCLE SURGERY     OVARIAN CYST REMOVAL      OB History     Gravida  0   Para      Term      Preterm      AB      Living         SAB      IAB      Ectopic      Multiple      Live Births               Home Medications    Prior to Admission medications   Medication Sig Start Date End Date Taking? Authorizing Provider  albuterol (VENTOLIN HFA) 108 (90 Base) MCG/ACT inhaler Inhale 2 puffs into the lungs every 6 (six) hours as needed for wheezing or shortness of breath. Patient not taking: Reported on 06/12/2020 03/23/19   Forrest Moron, MD  citalopram (CELEXA) 20 MG tablet Take 20 mg by mouth daily. 04/01/21   [provider]  diclofenac (VOLTAREN) 0.1 % ophthalmic solution  Place 1 drop into the right eye 4 (four) times daily. 03/14/21   Pearson Forster, NP  escitalopram (LEXAPRO) 20 MG tablet TAKE 1 TABLET BY MOUTH  DAILY 09/29/19   Forrest Moron, MD  norelgestromin-ethinyl estradiol Marilu Favre) 150-35 MCG/24HR transdermal patch Place 1 patch onto the skin once a week. Patient not taking: Reported on 06/12/2020 11/14/19   Forrest Moron, MD  trimethoprim-polymyxin b (POLYTRIM) ophthalmic solution Place 1 drop into the right eye every 4 (four) hours. 03/14/21   Pearson Forster, NP  valACYclovir (VALTREX) 500 MG tablet TAKE 1 TABLET BY MOUTH  TWICE DAILY FOR 5 DAYS FOR  OUTBREAKS 01/20/20   Forrest Moron, MD    Family History Family History  Problem Relation Age of Onset   Asthma Father    Hyperlipidemia Father    Asthma Brother    Cancer Mother        lung   Asthma Sister    Asthma Paternal Aunt    Asthma Paternal Grandmother  Social History Social History   Tobacco Use   Smoking status: Never   Smokeless tobacco: Never  Vaping Use   Vaping Use: Never used  Substance Use Topics   Alcohol use: No    Alcohol/week: 0.0 standard drinks    Comment: ETOh once a week, no binge drinker   Drug use: No     Allergies   Penicillins   Review of Systems Review of Systems  As stated above in HPI Physical Exam Triage Vital Signs ED Triage Vitals [05/10/21 2026]  Enc Vitals Group     BP 101/66     Pulse Rate (!) 118     Resp 18     Temp (!) 102 F (38.9 C)     Temp Source Oral     SpO2 98 %     Weight      Height      Head Circumference      Peak Flow      Pain Score 5     Pain Loc      Pain Edu?      Excl. in [place]?    No data found.  Updated Vital Signs BP 101/66 (BP Location: Right Arm)   Pulse (!) 118   Temp (!) 102 F (38.9 C) (Oral)   Resp 18   LMP 03/20/2021   SpO2 98%   Physical Exam Vitals and nursing note reviewed.  Constitutional:      General: She is not in acute distress.    Appearance: Normal appearance. She is  ill-appearing. She is not toxic-appearing or diaphoretic.  HENT:     Head: Normocephalic and atraumatic.     Right Ear: Tympanic membrane normal.     Left Ear: Tympanic membrane normal.     Nose: Congestion present.     Mouth/Throat:     Mouth: Mucous membranes are moist.     Pharynx: No oropharyngeal exudate or posterior oropharyngeal erythema.  Eyes:     Extraocular Movements: Extraocular movements intact.     Pupils: Pupils are equal, round, and reactive to light.  Cardiovascular:     Rate and Rhythm: Regular rhythm. Tachycardia present.     Heart sounds: Normal heart sounds.  Pulmonary:     Effort: Pulmonary effort is normal.     Breath sounds: Normal breath sounds.  Musculoskeletal:     Cervical back: Normal range of motion and neck supple.  Lymphadenopathy:     Cervical: No cervical adenopathy.  Skin:    General: Skin is warm.  Neurological:     Mental Status: She is alert and oriented to person, place, and time.     UC Treatments / Results  Labs (all labs ordered are listed, but only abnormal results are displayed) Labs Reviewed  SARS CORONAVIRUS 2 (TAT 6-24 HRS)    EKG   Radiology No results found.  Procedures Procedures (including critical care time)  Medications Ordered in UC Medications  acetaminophen (TYLENOL) tablet 650 mg (650 mg Oral Given 05/10/21 2031)    Initial Impression / Assessment and Plan / UC Course  I have reviewed the triage vital signs and the nursing notes.  Pertinent labs & imaging results that were available during my care of the patient were reviewed by me and considered in my medical decision making (see chart for details).     New. Influenza negative. Chest x ray without abnormality. With recent COVID-19 diagnosis I am concerned for pneumonia versus PE versus other.  With her  SOB and pleuritic chest pain I am recommended ER evaluation to rule out PE. Pt alerted and agreeable. She elects private vehicle transfer.    Final  Clinical Impressions(s) / UC Diagnoses   Final diagnoses:  None   Discharge Instructions   None    ED Prescriptions   None    PDMP not reviewed this encounter.   Hughie Closs, [place] 05/10/21 2055

## 2021-05-10 NOTE — Discharge Instructions (Addendum)
Please go to the ER for further evaluation °

## 2021-05-10 NOTE — ED Triage Notes (Signed)
Pt c/o elevated heart rates, lightheadedness, chills, chest tightness- patient states she does have asthma.

## 2021-05-10 NOTE — ED Triage Notes (Signed)
C/o cough, fever, shortness of breath and elevated heart rate.   Went to urgent care and had negative xray and negative flu test.  Pt states she was sitting on the floor and her watch alerted her - heart rate 132bpm.

## 2023-05-28 DIAGNOSIS — K146 Glossodynia: Secondary | ICD-10-CM | POA: Diagnosis not present

## 2023-05-28 DIAGNOSIS — J029 Acute pharyngitis, unspecified: Secondary | ICD-10-CM | POA: Diagnosis not present

## 2023-10-28 ENCOUNTER — Encounter (HOSPITAL_COMMUNITY): Payer: Self-pay | Admitting: *Deleted

## 2023-10-28 ENCOUNTER — Ambulatory Visit (HOSPITAL_COMMUNITY)
Admission: EM | Admit: 2023-10-28 | Discharge: 2023-10-28 | Disposition: A | Discharge status: Home / Self Care | Payer: No Typology Code available for payment source | Attending: Physician Assistant | Admitting: Physician Assistant

## 2023-10-28 ENCOUNTER — Other Ambulatory Visit: Payer: Self-pay

## 2023-10-28 DIAGNOSIS — R1011 Right upper quadrant pain: Secondary | ICD-10-CM | POA: Diagnosis present

## 2023-10-28 DIAGNOSIS — R197 Diarrhea, unspecified: Secondary | ICD-10-CM | POA: Diagnosis present

## 2023-10-28 DIAGNOSIS — R110 Nausea: Secondary | ICD-10-CM | POA: Insufficient documentation

## 2023-10-28 LAB — CBC WITH DIFFERENTIAL/PLATELET
Abs Immature Granulocytes: 0.02 10*3/uL (ref 0.00–0.07)
Basophils Absolute: 0 10*3/uL (ref 0.0–0.1)
Basophils Relative: 1 %
Eosinophils Absolute: 0.1 10*3/uL (ref 0.0–0.5)
Eosinophils Relative: 2 %
HCT: 41.8 % (ref 36.0–46.0)
Hemoglobin: 13.5 g/dL (ref 12.0–15.0)
Immature Granulocytes: 1 %
Lymphocytes Relative: 30 %
Lymphs Abs: 1.2 10*3/uL (ref 0.7–4.0)
MCH: 28.2 pg (ref 26.0–34.0)
MCHC: 32.3 g/dL (ref 30.0–36.0)
MCV: 87.4 fL (ref 80.0–100.0)
Monocytes Absolute: 0.8 10*3/uL (ref 0.1–1.0)
Monocytes Relative: 19 %
Neutro Abs: 2 10*3/uL (ref 1.7–7.7)
Neutrophils Relative %: 47 %
Platelets: 273 10*3/uL (ref 150–400)
RBC: 4.78 MIL/uL (ref 3.87–5.11)
RDW: 12.1 % (ref 11.5–15.5)
WBC: 4 10*3/uL (ref 4.0–10.5)
nRBC: 0 % (ref 0.0–0.2)

## 2023-10-28 LAB — BASIC METABOLIC PANEL
Anion gap: 10 (ref 5–15)
BUN: 9 mg/dL (ref 6–20)
CO2: 26 mmol/L (ref 22–32)
Calcium: 9.1 mg/dL (ref 8.9–10.3)
Chloride: 102 mmol/L (ref 98–111)
Creatinine, Ser: 0.91 mg/dL (ref 0.44–1.00)
GFR, Estimated: >60 mL/min (ref >60)
Glucose, Bld: 73 mg/dL (ref 70–99)
Potassium: 4.3 mmol/L (ref 3.5–5.1)
Sodium: 138 mmol/L (ref 135–145)

## 2023-10-28 LAB — HIV ANTIBODY (ROUTINE TESTING W REFLEX): HIV Screen 4th Generation wRfx: NONREACTIVE

## 2023-10-28 MED ORDER — ONDANSETRON 4 MG PO TBDP
ORAL_TABLET | ORAL | Status: AC
  Filled 2023-10-28: qty 1

## 2023-10-28 MED ORDER — IBUPROFEN 800 MG PO TABS
800.0000 mg | ORAL_TABLET | Freq: Once | ORAL | Status: AC
  Administered 2023-10-28: 800 mg via ORAL

## 2023-10-28 MED ORDER — IBUPROFEN 800 MG PO TABS
ORAL_TABLET | ORAL | Status: AC
  Filled 2023-10-28: qty 1

## 2023-10-28 MED ORDER — ONDANSETRON 4 MG PO TBDP
4.0000 mg | ORAL_TABLET | Freq: Once | ORAL | Status: AC
  Administered 2023-10-28: 4 mg via ORAL

## 2023-10-28 NOTE — ED Triage Notes (Signed)
TC to Pt # with no answer.

## 2023-10-28 NOTE — ED Triage Notes (Signed)
PT reports RUQ pain that started this AM. Pt has diarrhea ,fever . Fever started Monday morning  and fever started yesterday.

## 2023-10-28 NOTE — Discharge Instructions (Addendum)
At this time you are having severe right upper quadrant abdominal pain.  This location correlates with the liver and the gallbladder.  Given your severe tenderness I am concerned that you may have something going on with your gallbladder.  We do not have the imaging capabilities here to fully rule this out.  We are getting a blood count and electrolyte panel to make sure that you are not having anemia, signs of infection or dehydration. At this time it is recommended that you go to the emergency room for further evaluation and rule out of a gallbladder or abdominal emergency.  We will keep you updated with the results of your testing once it is available.  There is also the chance that your pain may be caused by a pulled muscle or costochondritis.  This can sometimes happen with severe coughing during an upper respiratory infection.  If your lab work is negative for signs of severe infection or dehydration you can try using over-the-counter medications such as Tylenol and ibuprofen for pain management. If your pain gets worse, you are unable to eat or drink and become dehydrated, you start passing out, you start having fevers that are not responding to medication, you start having difficulty breathing or shortness of breath please go immediately to the emergency room.

## 2023-10-28 NOTE — ED Provider Notes (Signed)
[facility]    CSN: [id number] Arrival date & time: 10/28/23  1501      History   Chief Complaint Chief Complaint  Patient presents with   Fever   Diarrhea    HPI Leah [name] is a 33 y.o. female.   HPI  She states she woke up Monday with fever, body aches She states Tuesday she still had fever and developed diarrhea- she reports difficulty tolerating PO intake She states she has had productive coughing with some flecks of blood She also reports right sided abdominal pain since this AM   She also reports ear fullness and sinus pressure, nasal congestion  Interventions: one dose of Sudafed last night   She is not sure if abdominal pain gets better or worse with PO intake as she has not been eating or drinking much    Past Medical History:  Diagnosis Date   Asthma    Herpes genitalia    Ovarian cyst     Patient Active Problem List   Diagnosis Date Noted   Oral herpes 03/23/2019   Well controlled intermittent asthma 03/23/2019   Dermoid cyst of left ovary 11/23/2013    Past Surgical History:  Procedure Laterality Date   EYE MUSCLE SURGERY     OVARIAN CYST REMOVAL      OB History     Gravida  0   Para      Term      Preterm      AB      Living         SAB      IAB      Ectopic      Multiple      Live Births               Home Medications    Prior to Admission medications   Medication Sig Start Date End Date Taking? Authorizing Provider  albuterol (VENTOLIN HFA) 108 (90 Base) MCG/ACT inhaler Inhale 2 puffs into the lungs every 6 (six) hours as needed for wheezing or shortness of breath. Patient not taking: Reported on 06/12/2020 03/23/19   Doristine Bosworth, MD  citalopram (CELEXA) 20 MG tablet Take 20 mg by mouth daily. 04/01/21   [provider]  diclofenac (VOLTAREN) 0.1 % ophthalmic solution Place 1 drop into the right eye 4 (four) times daily. 03/14/21   Ivette Loyal, NP  escitalopram (LEXAPRO) 20 MG  tablet TAKE 1 TABLET BY MOUTH  DAILY 09/29/19   Doristine Bosworth, MD  norelgestromin-ethinyl estradiol [employer]) 150-35 MCG/24HR transdermal patch Place 1 patch onto the skin once a week. Patient not taking: Reported on 06/12/2020 11/14/19   Doristine Bosworth, MD  trimethoprim-polymyxin b (POLYTRIM) ophthalmic solution Place 1 drop into the right eye every 4 (four) hours. 03/14/21   Ivette Loyal, NP  valACYclovir (VALTREX) 500 MG tablet TAKE 1 TABLET BY MOUTH  TWICE DAILY FOR 5 DAYS FOR  OUTBREAKS 01/20/20   Doristine Bosworth, MD    Family History Family History  Problem Relation Age of Onset   Asthma Father    Hyperlipidemia Father    Asthma Brother    Cancer Mother        lung   Asthma Sister    Asthma Paternal Aunt    Asthma Paternal Grandmother     Social History Social History   Tobacco Use   Smoking status: Never   Smokeless tobacco: Never  Vaping Use   Vaping  status: Never Used  Substance Use Topics   Alcohol use: No    Alcohol/week: 0.0 standard drinks of alcohol    Comment: ETOh once a week, no binge drinker   Drug use: No     Allergies   Penicillins   Review of Systems Review of Systems  Constitutional:  Positive for fatigue and fever.  HENT:  Positive for congestion and sinus pressure. Negative for postnasal drip and sore throat.   Respiratory:  Positive for cough. Negative for shortness of breath and wheezing.   Gastrointestinal:  Positive for abdominal pain and diarrhea. Negative for blood in stool, nausea and vomiting.  Musculoskeletal:  Positive for myalgias.  Neurological:  Positive for dizziness and headaches.     Physical Exam Triage Vital Signs ED Triage Vitals  Encounter Vitals Group     BP 10/28/23 1547 119/75     Systolic BP Percentile --      Diastolic BP Percentile --      Pulse Rate 10/28/23 1547 (!) 112     Resp 10/28/23 1547 20     Temp 10/28/23 1547 99.1 F (37.3 C)     Temp src --      SpO2 10/28/23 1547 98 %     Weight --       Height --      Head Circumference --      Peak Flow --      Pain Score 10/28/23 1544 10     Pain Loc --      Pain Education --      Exclude from Growth Chart --    No data found.  Updated Vital Signs BP 119/75   Pulse (!) 112   Temp 99.1 F (37.3 C)   Resp 20   LMP 10/17/2023   SpO2 98%   Visual Acuity Right Eye Distance:   Left Eye Distance:   Bilateral Distance:    Right Eye Near:   Left Eye Near:    Bilateral Near:     Physical Exam Vitals reviewed.  Constitutional:      General: She is awake. She is in acute distress.     Appearance: Normal appearance. She is well-developed and well-groomed. She is ill-appearing.  HENT:     Head: Normocephalic and atraumatic.     Mouth/Throat:     Lips: Pink.     Mouth: Mucous membranes are moist.     Pharynx: Oropharynx is clear. Uvula midline. No oropharyngeal exudate, posterior oropharyngeal erythema or postnasal drip.  Eyes:     General: Lids are normal. Gaze aligned appropriately.  Pulmonary:     Effort: Pulmonary effort is normal.     Breath sounds: Normal breath sounds. No decreased air movement. No decreased breath sounds, wheezing, rhonchi or rales.  Abdominal:     General: Abdomen is flat. Bowel sounds are normal.     Palpations: Abdomen is soft.     Tenderness: There is abdominal tenderness in the right upper quadrant. There is no guarding. Positive signs include Murphy's sign.  Musculoskeletal:     Cervical back: Normal range of motion and neck supple.  Lymphadenopathy:     Head:     Right side of head: No submental, submandibular or preauricular adenopathy.     Left side of head: No submental, submandibular or preauricular adenopathy.     Cervical:     Right cervical: No superficial cervical adenopathy.    Left cervical: No superficial cervical adenopathy.  Upper Body:     Right upper body: No supraclavicular adenopathy.     Left upper body: No supraclavicular adenopathy.  Skin:    General: Skin is warm  and dry.  Neurological:     Mental Status: She is alert.  Psychiatric:        Attention and Perception: Attention normal.        Mood and Affect: Mood is anxious. Affect is tearful.        Speech: Speech normal.        Behavior: Behavior normal. Behavior is cooperative.      UC Treatments / Results  Labs (all labs ordered are listed, but only abnormal results are displayed) Labs Reviewed  CBC WITH DIFFERENTIAL/PLATELET  BASIC METABOLIC PANEL  RPR  HIV ANTIBODY (ROUTINE TESTING W REFLEX)  CERVICOVAGINAL ANCILLARY ONLY    EKG   Radiology No results found.  Procedures Procedures (including critical care time)  Medications Ordered in UC Medications  ibuprofen (ADVIL) tablet 800 mg (800 mg Oral Given 10/28/23 1631)  ondansetron (ZOFRAN-ODT) disintegrating tablet 4 mg (4 mg Oral Given 10/28/23 1631)    Initial Impression / Assessment and Plan / UC Course  I have reviewed the triage vital signs and the nursing notes.  Pertinent labs & imaging results that were available during my care of the patient were reviewed by me and considered in my medical decision making (see chart for details).      Final Clinical Impressions(s) / UC Diagnoses   Final diagnoses:  Nausea  Continuous RUQ abdominal pain  Diarrhea, unspecified type   Acute, new concern  patient presents today with concerns for severe right upper quadrant tenderness and pain.  She also reports concerns for fever, coughing, congestion and sinus pressure.  Reviewed with patient that her severe tenderness and fever could potentially be caused by cholecystitis or even cholangitis.  Reviewed that this would be an emergency and she would need to be seen in the emergency room for further evaluation and management.  She became very upset during this discussion as she did not want to go to the emergency room at this time.  Reviewed that her symptoms could also be caused by costochondritis or a pulled muscle from coughing.   She is not taking any medications to manage her symptoms so it is difficult to know if they would be responsive to over-the-counter medications.  Reviewed the options with patient regarding disposition.  Reviewed that we can run CBC, BNP for rule out of more severe infection.  CBC reviewed at around 830 and showed normal WBC and no signs of anemia.  Ibuprofen provided in clinic today.  Patient also expressed concerns for potential STD exposure.  Will get cervicovaginal swab, RPR, HIV for further rule out per request.  Results to dictate further management. Recommend watchful monitoring with strict emergency room protocols.  Reviewed these with the patient and she is in agreement.  Reviewed that if her abdominal pain gets worse, her fevers are not responding to over-the-counter medications, she is developing shortness of breath, nausea and vomiting that is uncontrolled she should go immediately to the emergency room.  Zofran provided to assist with nausea so that she can tolerate further p.o. intake and improve hydration status.  Recommend over-the-counter medications for symptomatic management-DayQuil/NyQuil, TheraFlu, Alka-Seltzer per preference.  She can also supplement with ibuprofen as needed for fever management.  Follow-up as needed for progressing or persistent symptoms.     Discharge Instructions  At this time you are having severe right upper quadrant abdominal pain.  This location correlates with the liver and the gallbladder.  Given your severe tenderness I am concerned that you may have something going on with your gallbladder.  We do not have the imaging capabilities here to fully rule this out.  We are getting a blood count and electrolyte panel to make sure that you are not having anemia, signs of infection or dehydration. At this time it is recommended that you go to the emergency room for further evaluation and rule out of a gallbladder or abdominal emergency.  We will keep you updated  with the results of your testing once it is available.  There is also the chance that your pain may be caused by a pulled muscle or costochondritis.  This can sometimes happen with severe coughing during an upper respiratory infection.  If your lab work is negative for signs of severe infection or dehydration you can try using over-the-counter medications such as Tylenol and ibuprofen for pain management. If your pain gets worse, you are unable to eat or drink and become dehydrated, you start passing out, you start having fevers that are not responding to medication, you start having difficulty breathing or shortness of breath please go immediately to the emergency room.      ED Prescriptions   None    PDMP not reviewed this encounter.   Roselind Messier 10/28/23 2043

## 2023-10-29 LAB — RPR: RPR Ser Ql: NONREACTIVE
